# Patient Record
Sex: Male | Born: 1950 | Race: White | Hispanic: No | Marital: Married | Smoking: Former smoker
Health system: Southern US, Community
[De-identification: ages and names within clinical notes are randomized; demographics above are authoritative.]

## PROBLEM LIST (undated history)

## (undated) DIAGNOSIS — F32A Depression, unspecified: Secondary | ICD-10-CM

## (undated) DIAGNOSIS — F419 Anxiety disorder, unspecified: Secondary | ICD-10-CM

## (undated) DIAGNOSIS — G4730 Sleep apnea, unspecified: Secondary | ICD-10-CM

## (undated) DIAGNOSIS — K7460 Unspecified cirrhosis of liver: Secondary | ICD-10-CM

## (undated) DIAGNOSIS — F329 Major depressive disorder, single episode, unspecified: Secondary | ICD-10-CM

## (undated) DIAGNOSIS — M1990 Unspecified osteoarthritis, unspecified site: Secondary | ICD-10-CM

## (undated) DIAGNOSIS — Z8719 Personal history of other diseases of the digestive system: Secondary | ICD-10-CM

## (undated) DIAGNOSIS — I10 Essential (primary) hypertension: Secondary | ICD-10-CM

## (undated) DIAGNOSIS — K219 Gastro-esophageal reflux disease without esophagitis: Secondary | ICD-10-CM

## (undated) DIAGNOSIS — K759 Inflammatory liver disease, unspecified: Secondary | ICD-10-CM

## (undated) DIAGNOSIS — R0602 Shortness of breath: Secondary | ICD-10-CM

---

## 1988-07-05 HISTORY — PX: LITHOTRIPSY: SUR834

## 1989-07-05 DIAGNOSIS — G4730 Sleep apnea, unspecified: Secondary | ICD-10-CM

## 1989-07-05 HISTORY — DX: Sleep apnea, unspecified: G47.30

## 1996-07-05 HISTORY — PX: TONSILLECTOMY: SUR1361

## 1996-07-05 HISTORY — PX: TONSILLECTOMY AND ADENOIDECTOMY: SUR1326

## 2002-07-05 HISTORY — PX: BACK SURGERY: SHX140

## 2003-03-15 ENCOUNTER — Encounter: Payer: Self-pay | Admitting: Neurosurgery

## 2003-03-19 ENCOUNTER — Encounter: Payer: Self-pay | Admitting: Neurosurgery

## 2003-03-19 ENCOUNTER — Ambulatory Visit (HOSPITAL_COMMUNITY): Admission: RE | Admit: 2003-03-19 | Discharge: 2003-03-20 | Payer: Self-pay | Admitting: Neurosurgery

## 2003-12-16 ENCOUNTER — Other Ambulatory Visit: Payer: Self-pay

## 2006-01-08 ENCOUNTER — Emergency Department: Payer: Self-pay | Admitting: Emergency Medicine

## 2006-01-11 ENCOUNTER — Other Ambulatory Visit: Payer: Self-pay

## 2006-01-11 ENCOUNTER — Inpatient Hospital Stay: Payer: Self-pay | Admitting: Specialist

## 2006-01-18 ENCOUNTER — Ambulatory Visit: Payer: Self-pay | Admitting: Specialist

## 2006-01-27 ENCOUNTER — Ambulatory Visit: Payer: Self-pay | Admitting: Specialist

## 2006-05-18 ENCOUNTER — Other Ambulatory Visit: Payer: Self-pay

## 2006-05-18 ENCOUNTER — Emergency Department: Payer: Self-pay | Admitting: Emergency Medicine

## 2007-12-12 IMAGING — US ABDOMEN ULTRASOUND LIMITED
1 series · 17 of 25 positions shown · non-contrast
Comparison: none

REASON FOR EXAM: doppler ultrasound of portal and hepatic system; hepatic
encephalopathy
COMMENTS:

[Series 1: abdomen ultrasound limited · 17 of 31 slices shown]
[im 1/31]
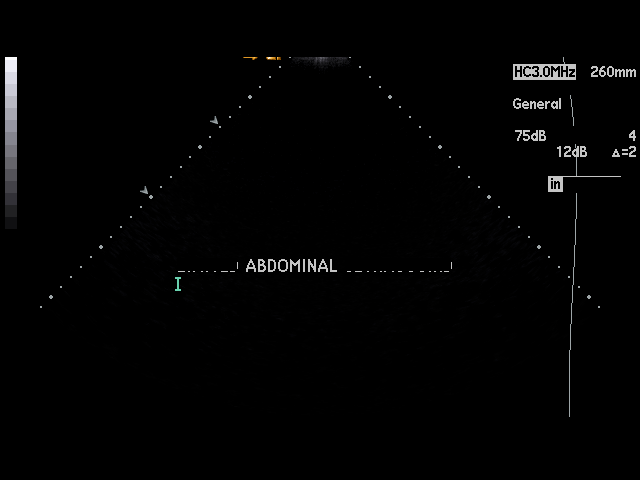
[im 3/31]
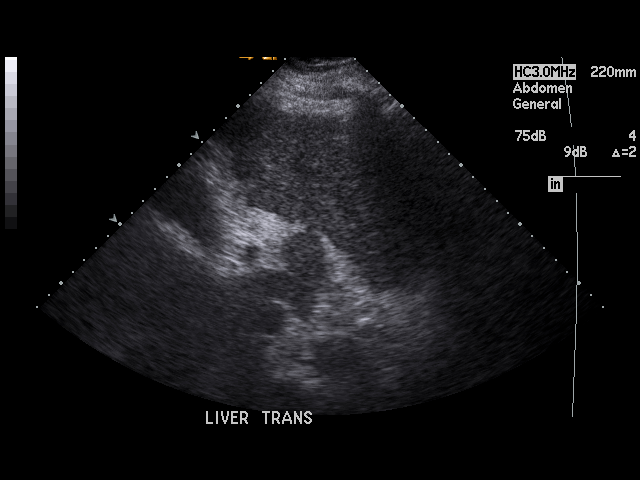
[im 4/31]
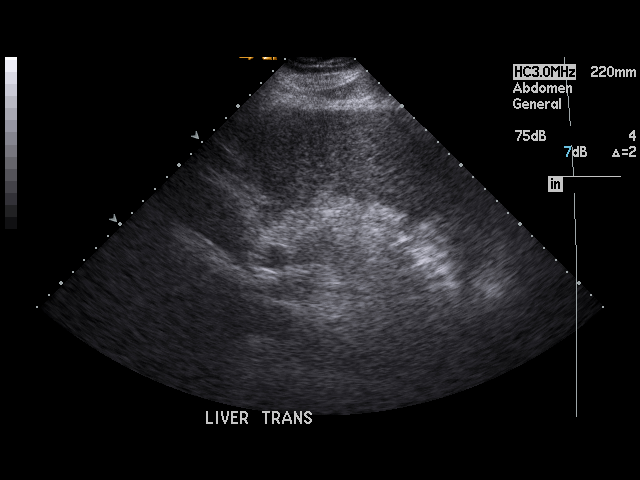
[im 7/31]
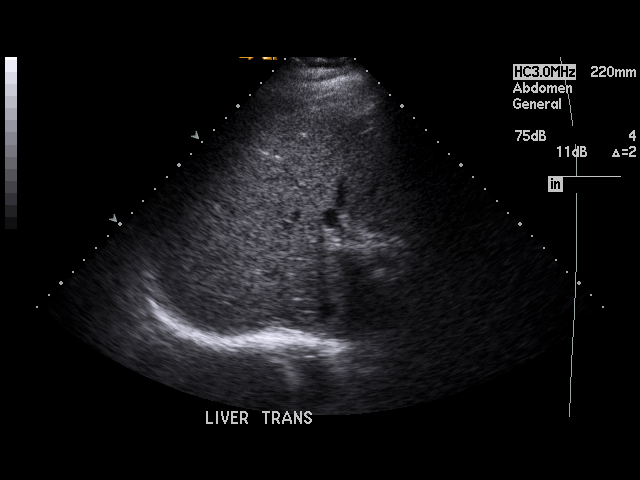
[im 8/31]
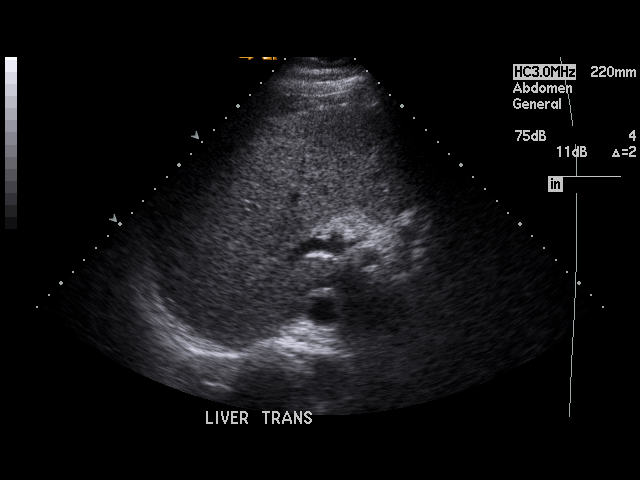
[im 11/31]
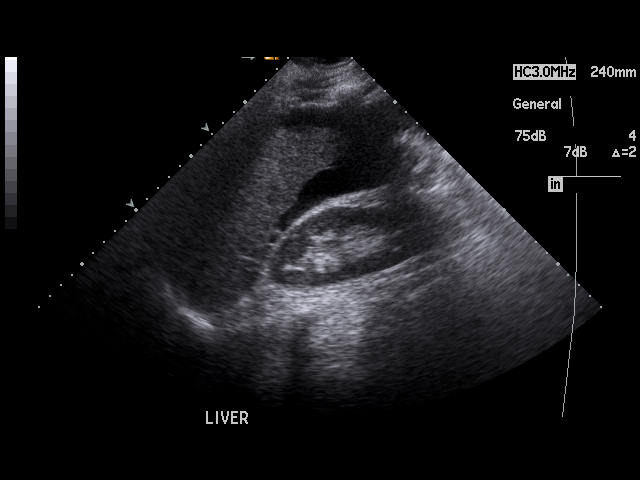
[im 12/31]
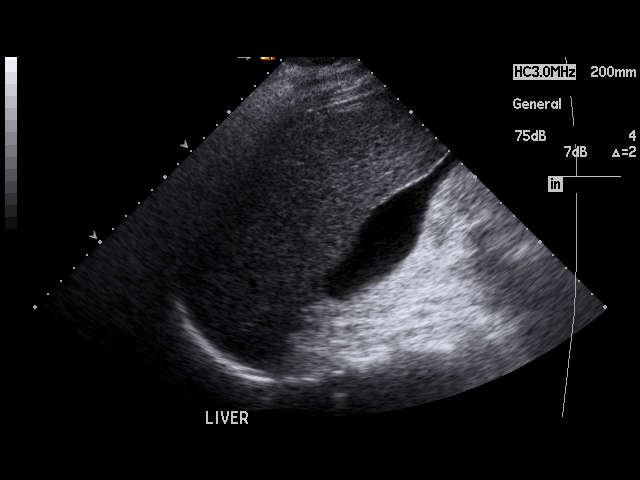
[im 14/31]
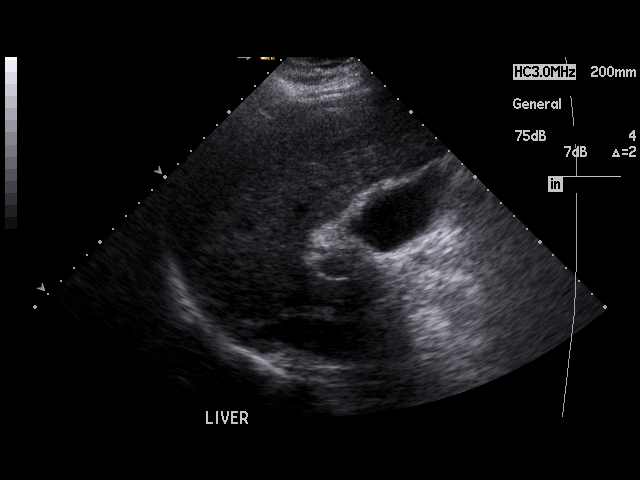
[im 16/31]
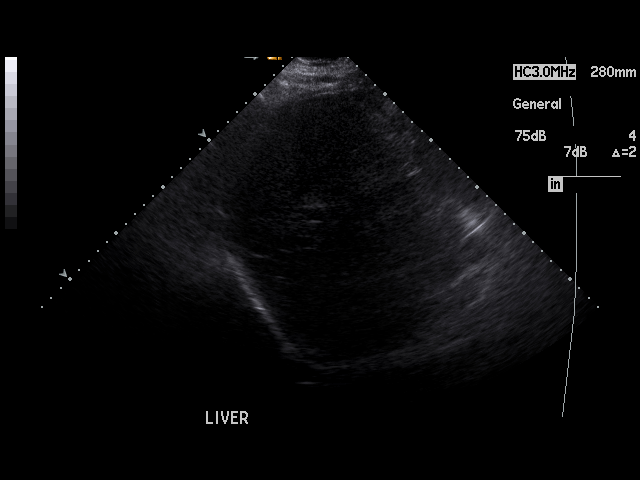
[im 17/31]
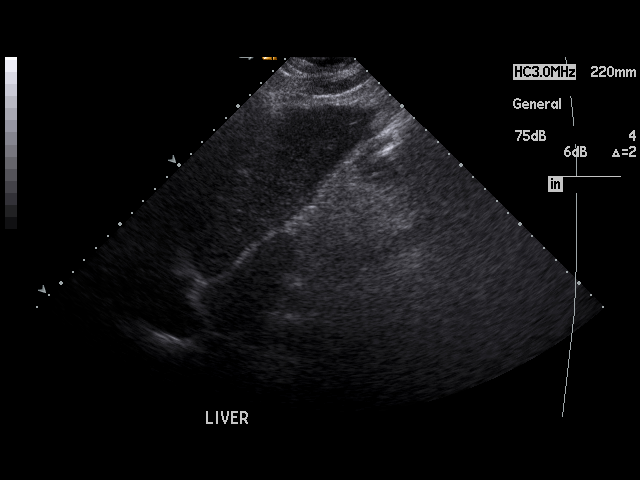
[im 19/31]
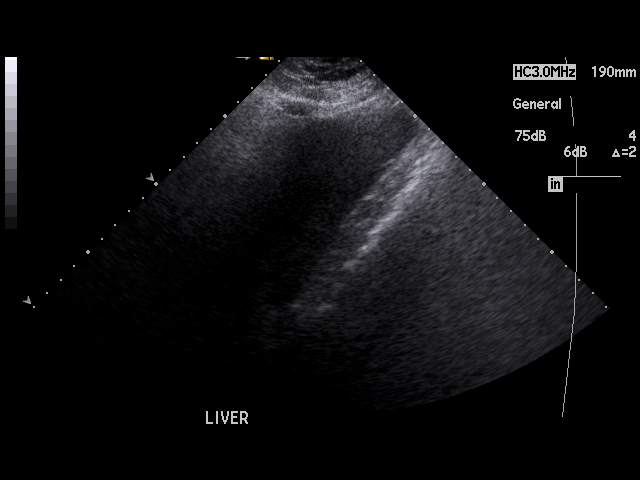
[im 21/31]
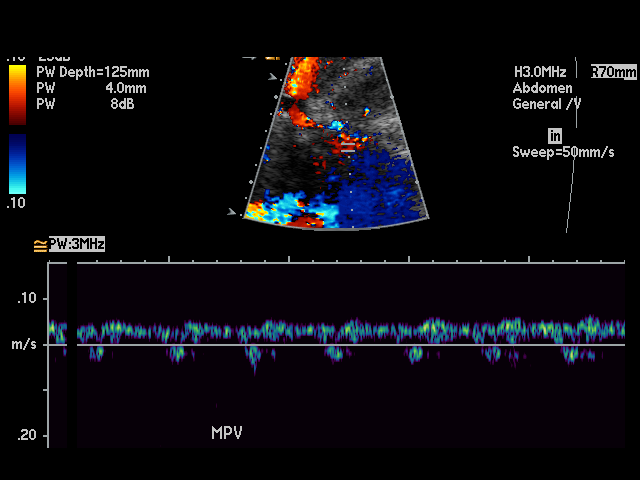
[im 23/31]
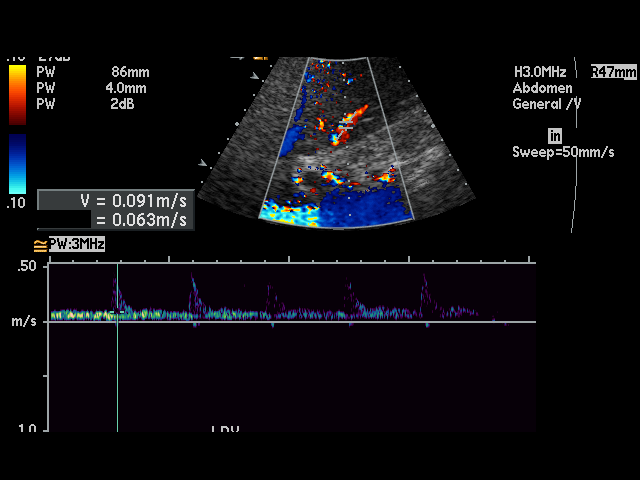
[im 24/31]
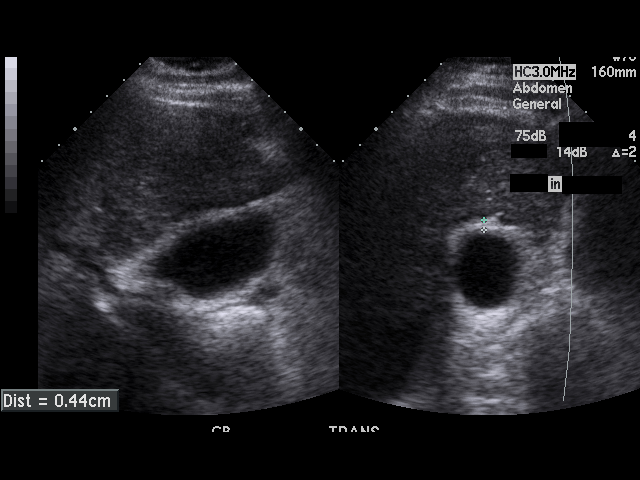
[im 27/31]
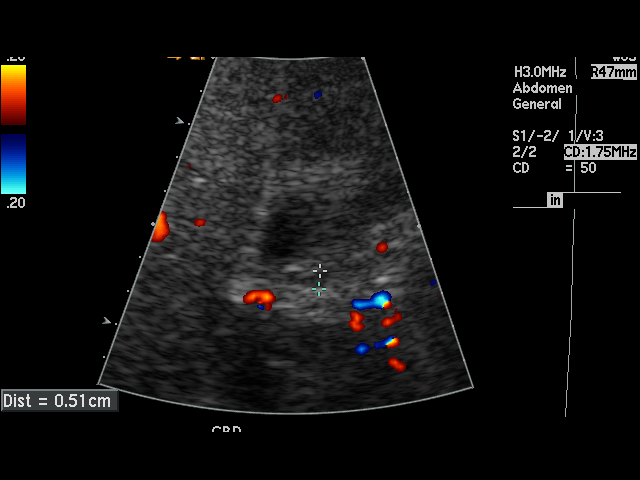
[im 28/31]
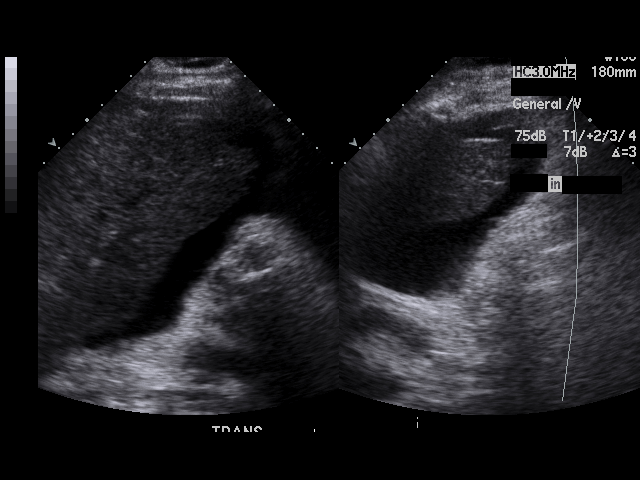
[im 31/31]
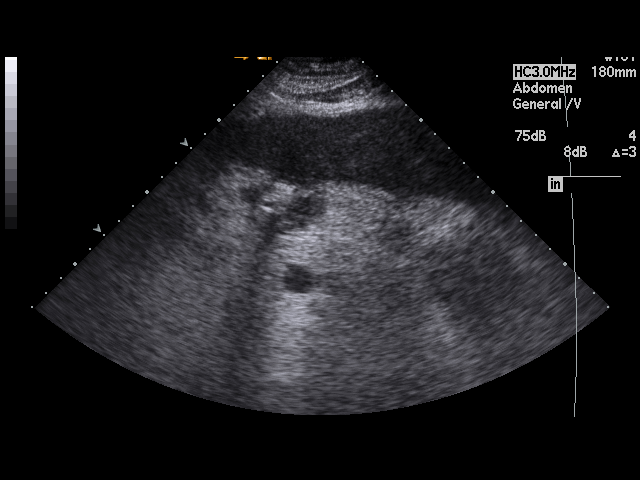

[17 of 25 positions shown; findings below may reference images not displayed]

PROCEDURE:     US  - US ABDOMEN LIMITED SURVEY  - January 12, 2006  [DATE]

RESULT:     Limited abdominal ultrasound examination for evaluation of the
liver and portal vein was performed. The liver is dense, suspicious for
fatty infiltration. No focal hepatic mass lesions are identified.  There is
noted a small amount of ascites inferior to the liver. Interrogation of the
portal vein shows hepatopetal flow.  The common bile duct measures 5.1 mm in
diameter, which is within normal limits. There is thickening of the
gallbladder wall, which measured 4.4 mm in thickness.  No gallstones are
seen.
IMPRESSION: 1)Probable fatty infiltration of the liver.

2)Ascites.

3)Hepatopetal flow is observed in the portal vein.

4)There is thickening of the gallbladder wall, but no gallstones are seen.

## 2007-12-13 IMAGING — CT CT ABD-PELV W/ CM
1 of 2 series · 15 of 32 positions shown, 19 images · non-contrast
Comparison: none

REASON FOR EXAM: Portal hypertension, hepatic vien thrombosis
COMMENTS:

[Series 2: abdomen · axial · 0.82mm/px · z∈[-22,+442]mm · 15 of 64 slices shown, 19 images]
[im 3/64  soft-tissue]
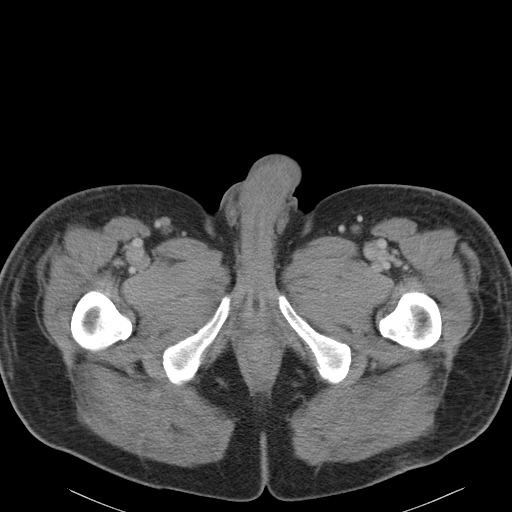
[im 3/64  bone]
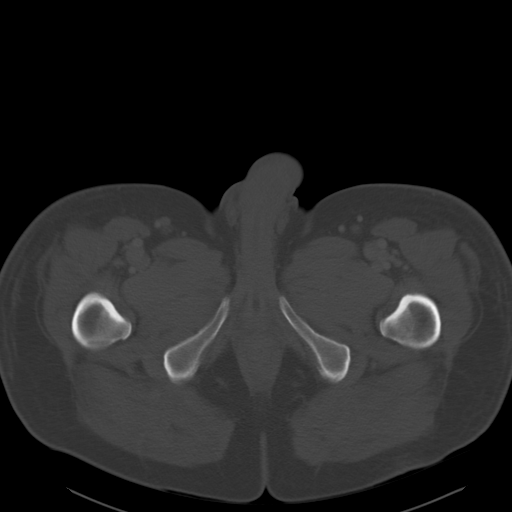
[im 8/64  soft-tissue]
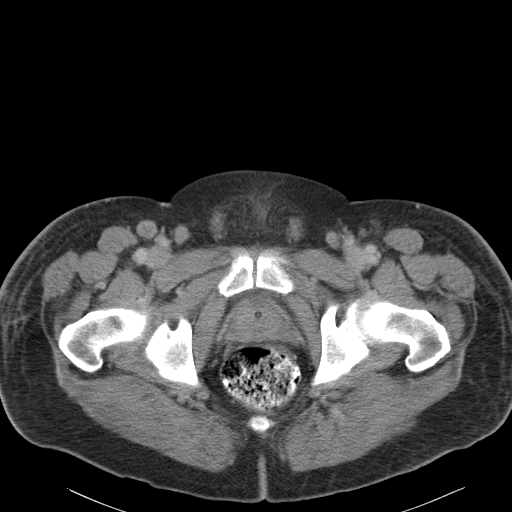
[im 13/64  soft-tissue]
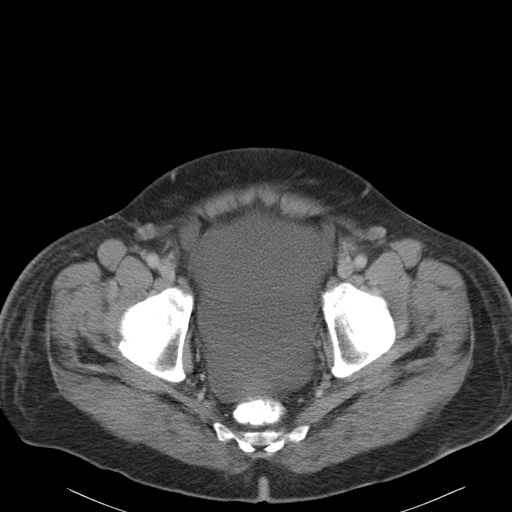
[im 17/64  soft-tissue]
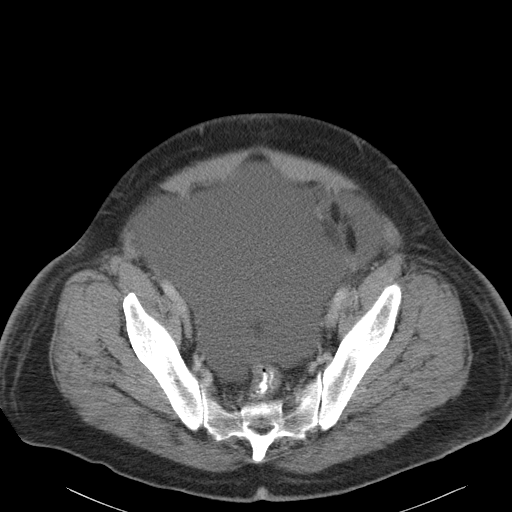
[im 22/64  soft-tissue]
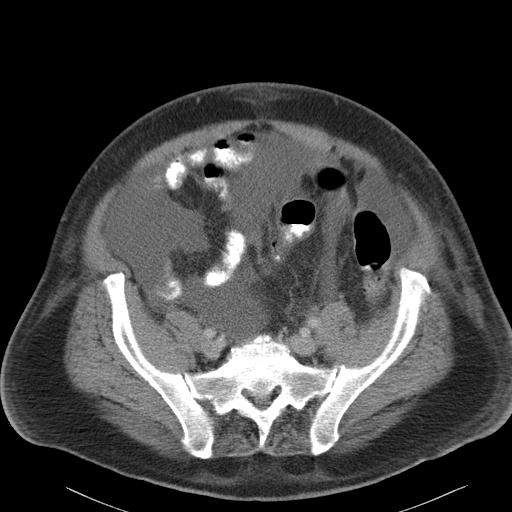
[im 27/64  soft-tissue]
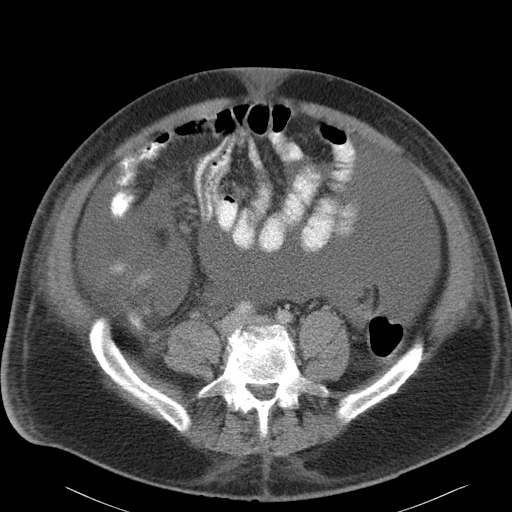
[im 32/64  soft-tissue]
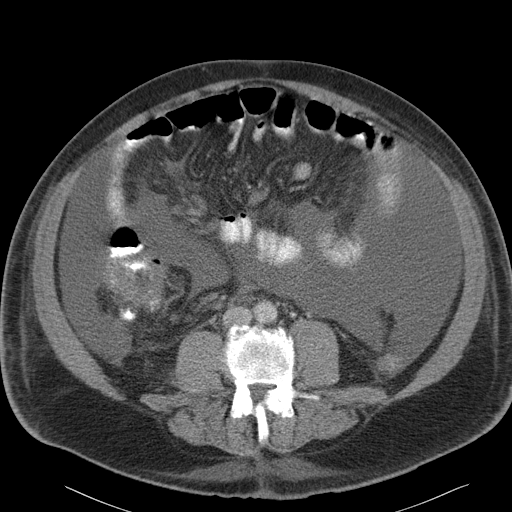
[im 37/64  soft-tissue]
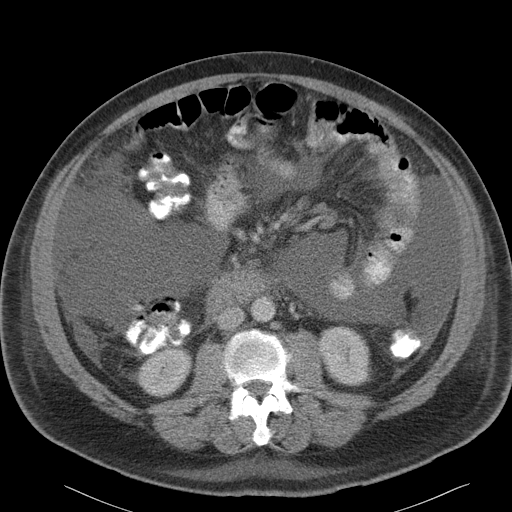
[im 42/64  soft-tissue]
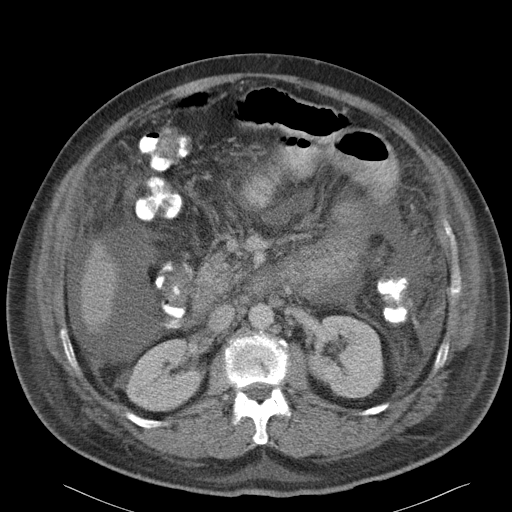
[im 42/64  bone]
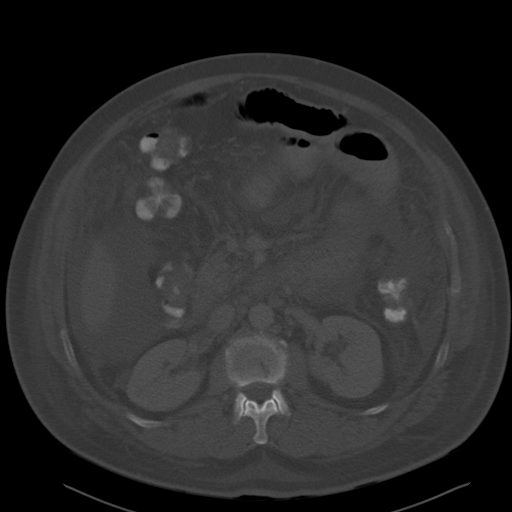
[im 47/64  soft-tissue]
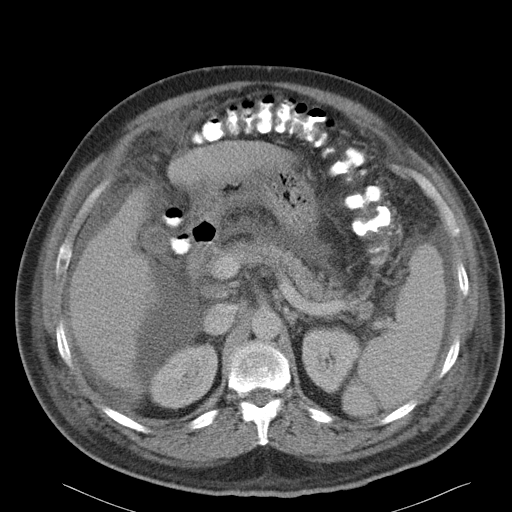
[im 51/64  soft-tissue]
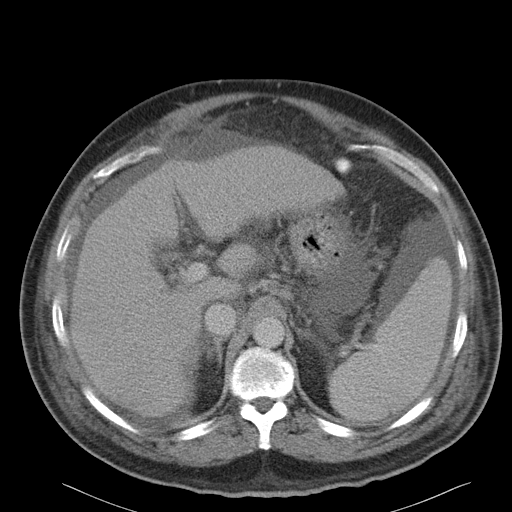
[im 54/64  lung]
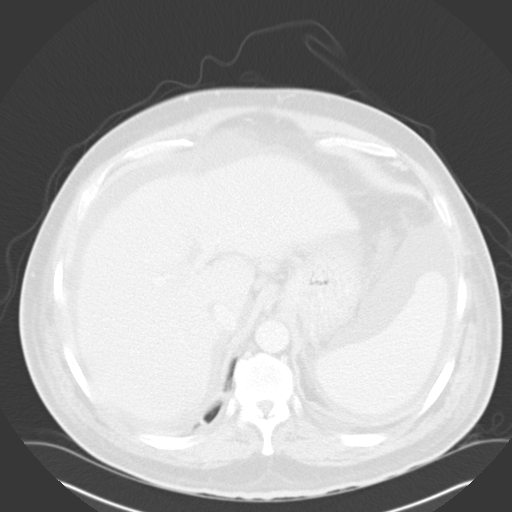
[im 56/64  soft-tissue]
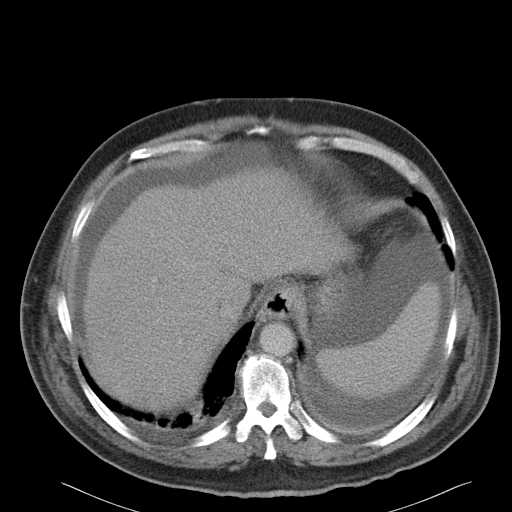
[im 56/64  lung]
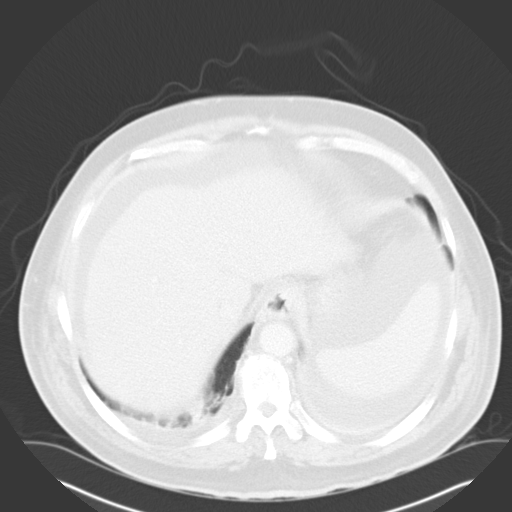
[im 59/64  lung]
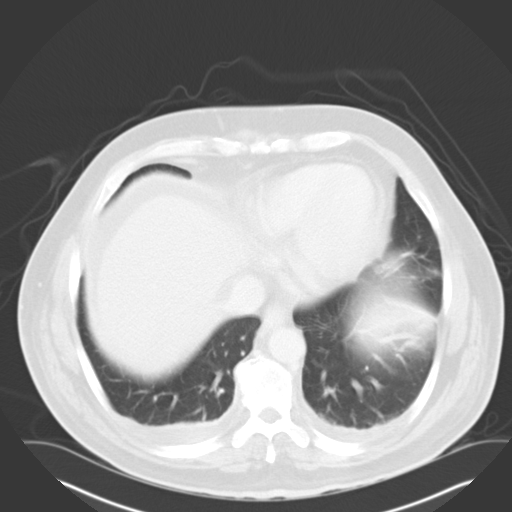
[im 61/64  soft-tissue]
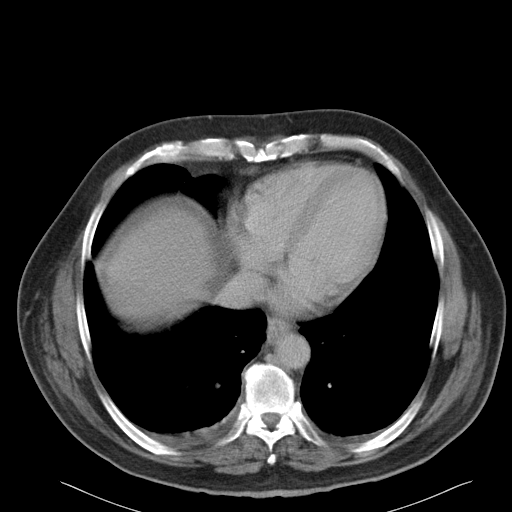
[im 61/64  lung]
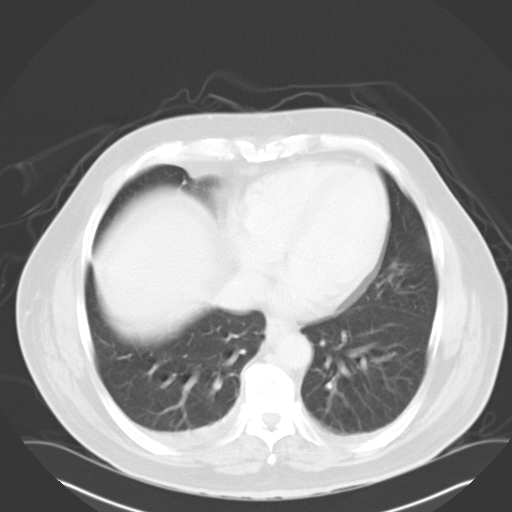

[15 of 32 positions shown; findings below may reference images not displayed]

PROCEDURE:     CT  - CT ABDOMEN / PELVIS  W  - January 13, 2006 [DATE]

RESULT:     The patient is being evaluated for possible hepatic vein
thrombosis.

There is ascites present. There is no intrahepatic ductal dilation. The
caliber of the inferior vena cava appears normal. The portal vein is normal
in density. I do not see findings suspicious for varices. The spleen is not
enlarged. There are tiny, bilateral pleural effusions layering posteriorly.
The nondistended stomach is normal in appearance. The pancreas exhibits no
focal mass or evidence of inflammatory change. The caliber of the abdominal
aorta is normal. The gallbladder is contracted. There are no adrenal masses.
The kidneys enhance well. The periaortic and pericaval regions exhibit no
evidence of lymphadenopathy. Within the pelvis, there is considerable free
fluid. There are mildly enlarged inguinal lymph nodes bilaterally. The
sigmoid colon is grossly normal. The urinary bladder is decompressed due to
the presence of a Foley catheter. The lung parenchyma in the costophrenic
gutters posteriorly exhibits no acute abnormality though I suspect there is
some atelectasis present.
IMPRESSION: 1.     There is considerable ascites present. The liver does not appear
markedly decreased in size but its convex outer surface is slightly
irregular. I do not see splenomegaly nor definite evidence of varices.
2.     The appearance of the portal vein is within the limits of normal.
There are no findings to suggest dilated hepatic veins.
3.     There are tiny, bilateral pleural effusions.
4.     The gallbladder is contracted without evidence of calcified stones.

## 2008-07-16 ENCOUNTER — Emergency Department: Payer: Self-pay | Admitting: Internal Medicine

## 2008-11-19 ENCOUNTER — Ambulatory Visit: Payer: Self-pay | Admitting: Nurse Practitioner

## 2010-06-08 ENCOUNTER — Ambulatory Visit: Payer: Self-pay | Admitting: Nurse Practitioner

## 2011-04-21 ENCOUNTER — Ambulatory Visit: Payer: Self-pay | Admitting: Nurse Practitioner

## 2011-05-12 ENCOUNTER — Ambulatory Visit: Payer: Self-pay | Admitting: Nurse Practitioner

## 2011-07-31 ENCOUNTER — Emergency Department: Payer: Self-pay | Admitting: Emergency Medicine

## 2011-10-15 LAB — COMPREHENSIVE METABOLIC PANEL
Albumin: 3.7 g/dL (ref 3.4–5.0)
Calcium, Total: 8.7 mg/dL (ref 8.5–10.1)
Chloride: 103 mmol/L (ref 98–107)
Co2: 31 mmol/L (ref 21–32)
Glucose: 116 mg/dL — ABNORMAL HIGH (ref 65–99)
Osmolality: 280 (ref 275–301)
SGOT(AST): 36 U/L (ref 15–37)
Sodium: 140 mmol/L (ref 136–145)

## 2011-10-15 LAB — CBC
HCT: 45.4 % (ref 40.0–52.0)
HGB: 15 g/dL (ref 13.0–18.0)
MCH: 30.1 pg (ref 26.0–34.0)
MCHC: 33.1 g/dL (ref 32.0–36.0)
Platelet: 117 10*3/uL — ABNORMAL LOW (ref 150–440)
RDW: 14 % (ref 11.5–14.5)

## 2011-10-15 LAB — CK TOTAL AND CKMB (NOT AT ARMC)
CK, Total: 343 U/L — ABNORMAL HIGH (ref 35–232)
CK-MB: 3 ng/mL (ref 0.5–3.6)

## 2011-10-16 ENCOUNTER — Observation Stay: Payer: Self-pay | Admitting: Internal Medicine

## 2011-10-16 LAB — LIPID PANEL: Cholesterol: 153 mg/dL (ref 0–200)

## 2011-10-16 LAB — URINALYSIS, COMPLETE
Blood: NEGATIVE
Leukocyte Esterase: NEGATIVE
Nitrite: NEGATIVE
Ph: 7 (ref 4.5–8.0)

## 2011-10-19 ENCOUNTER — Ambulatory Visit: Payer: Self-pay | Admitting: Internal Medicine

## 2012-05-12 ENCOUNTER — Ambulatory Visit: Payer: Self-pay | Admitting: Nurse Practitioner

## 2012-05-12 LAB — CREATININE, SERUM: Creatinine: 1.05 mg/dL (ref 0.60–1.30)

## 2012-07-07 ENCOUNTER — Other Ambulatory Visit: Payer: Self-pay | Admitting: Neurosurgery

## 2012-07-10 NOTE — Addendum Note (Signed)
Addended by: Julio Sicks on: 07/10/2012 07:40 AM   Modules accepted: Orders

## 2012-07-17 ENCOUNTER — Encounter (HOSPITAL_COMMUNITY): Payer: Self-pay

## 2012-07-17 ENCOUNTER — Encounter (HOSPITAL_COMMUNITY)
Admission: RE | Admit: 2012-07-17 | Discharge: 2012-07-17 | Disposition: A | Discharge status: Home / Self Care | Payer: Medicare Other | Source: Ambulatory Visit | Attending: Neurosurgery | Admitting: Neurosurgery

## 2012-07-17 HISTORY — DX: Unspecified osteoarthritis, unspecified site: M19.90

## 2012-07-17 HISTORY — DX: Anxiety disorder, unspecified: F41.9

## 2012-07-17 HISTORY — DX: Sleep apnea, unspecified: G47.30

## 2012-07-17 HISTORY — DX: Unspecified cirrhosis of liver: K74.60

## 2012-07-17 HISTORY — DX: Gastro-esophageal reflux disease without esophagitis: K21.9

## 2012-07-17 HISTORY — DX: Major depressive disorder, single episode, unspecified: F32.9

## 2012-07-17 HISTORY — DX: Shortness of breath: R06.02

## 2012-07-17 HISTORY — DX: Inflammatory liver disease, unspecified: K75.9

## 2012-07-17 HISTORY — DX: Personal history of other diseases of the digestive system: Z87.19

## 2012-07-17 HISTORY — DX: Essential (primary) hypertension: I10

## 2012-07-17 HISTORY — DX: Depression, unspecified: F32.A

## 2012-07-17 LAB — CBC WITH DIFFERENTIAL/PLATELET
Basophils Absolute: 0 10*3/uL (ref 0.0–0.1)
Eosinophils Relative: 2 % (ref 0–5)
Lymphocytes Relative: 37 % (ref 12–46)
MCV: 91.4 fL (ref 78.0–100.0)
Neutrophils Relative %: 47 % (ref 43–77)
Platelets: 138 10*3/uL — ABNORMAL LOW (ref 150–400)
RDW: 12.9 % (ref 11.5–15.5)
WBC: 7.6 10*3/uL (ref 4.0–10.5)

## 2012-07-17 LAB — COMPREHENSIVE METABOLIC PANEL
ALT: 20 U/L (ref 0–53)
AST: 23 U/L (ref 0–37)
Albumin: 3.9 g/dL (ref 3.5–5.2)
Calcium: 10.3 mg/dL (ref 8.4–10.5)
Creatinine, Ser: 0.83 mg/dL (ref 0.50–1.35)
GFR calc non Af Amer: >90 mL/min (ref >90)
Sodium: 138 mEq/L (ref 135–145)
Total Protein: 7.7 g/dL (ref 6.0–8.3)

## 2012-07-17 LAB — TYPE AND SCREEN
ABO/RH(D): O NEG
Antibody Screen: NEGATIVE

## 2012-07-17 LAB — APTT: aPTT: 31 seconds (ref 24–37)

## 2012-07-17 LAB — ABO/RH: ABO/RH(D): O NEG

## 2012-07-17 LAB — SURGICAL PCR SCREEN
MRSA, PCR: NEGATIVE
Staphylococcus aureus: POSITIVE — AB

## 2012-07-17 LAB — PROTIME-INR: INR: 1.08 (ref 0.00–1.49)

## 2012-07-17 MED ORDER — DEXTROSE 5 % IV SOLN
3.0000 g | INTRAVENOUS | Status: AC
  Administered 2012-07-18: 3 g via INTRAVENOUS
  Filled 2012-07-17: qty 3000

## 2012-07-17 NOTE — Progress Notes (Signed)
Primary: Meredith Mody at [facility]. [phone]- will request notes, ekg  Dr. Allena Katz, at [facility] ( liver cirrhosis) last visit 6 months ago- will request notes  [facility] : stress test 10/13, ekg, sleep study months ago-- will request  Dr. Lynn Ito (cardiologist) seen once echo couple yrs. 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

## 2012-07-17 NOTE — Consult Note (Signed)
Anesthesia Consult (per patient request):  Patient is a 62 year old male posted for 2 level lumbar laminectomy/decompression microdiscectomy on 07/18/12 at 0730 by Dr. Jordan Likes  His PAT appointment was on 07/17/12.  History includes HTN (he did not take his medications this morning), cirrhosis with history of  Hepatitis C s/p two different courses in Interferon and ribavirin with HCV RNA negative 02/10/06, Dr. Marice Potter, [facility], OSA without CPAP use due to claustrophobia, chronic DOE, morbid obesity (BMI 40), GERD, anxiety, depression, hiatal hernia, arthritis, T&A '98, back surgery '04.  PCP is Ninfa Linden, NP at [facility].  Nuclear stress test on 10/19/11 [facility]) showed no significant stress induced defects, EF 71% with normal wall motion.  EKG at the time showed NSR, incomplete right BBB.    He thinks he had an echo at [facility] (Dr. Park Breed) 2-3 years ago (report requested, but is still pending.)  Sleep study on 05/12/11 showed OSA, snoring.  Weight loss and Bi-level trestment at 24/16 cmH20 pressure, humidified, with a ramp recommended.  He has not been compliant with CPAP due to claustrophobia, but says he may be able to tolerate it if he takes either Xanax or Lunesta prior to bedtime.  (He does not currently have a CPAP machine and says Medicare is asking him to repeat another sleep study before one is issued since it has been > 6 months.)  CXR report on 07/17/12 showed no active cardiopulmonary disease. Chronic changes related to COPD  are noted.  Pre-operative labs noted.  AST/ALT WNL.  PLT 138.  PT/PTT WNL.  Glucose 178.  Cr 0.83.  Exam shows a pleasant, Caucasian male in NAD.  Abdomen obese.  Neck large.  Heart RRR, no murmur noted.  Lungs clear.  No significant LE edema.  No carotid bruits noted, but not well heard on the right.  He denies chest pain, SOB at rest.  He does have chronic DOE with mild activity.  His activity is significantly limited due to back and hip  pain.  He has not required a paracentesis since ~ 2006.  He has not had any recent acute symptoms felt related to his cirrhosis.    Despite his history of Hepatitis C with cirrhosis, his labs appear acceptable.  By records received, it has been 2012 since patient was last seen at [facility] liver clinic, but was compensated at that time.  This seems to be reflected by his most recent PCP notes.  He had a normal stress test within the past year.  We discussed potential need for post-operative bi-PAP/CPAP in patients with OSA and likely need for him to stay at least overnight with this history.    Shonna Chock, PA-C 07/17/12 1110

## 2012-07-17 NOTE — Pre-Procedure Instructions (Signed)
Jemel Ono Ballester  07/17/2012   Your procedure is scheduled on:  Tuesday Jan. 14,2014  Report to [facility] at 5:30 AM.  Call this number if you have problems the morning of surgery: [phone]   Remember:   Do not eat food or drink liquids after midnight.   Take these medicines the morning of surgery with A SIP OF WATER: xanax, neurontin, pain pill, inderal, prilosec, effexor   Do not wear jewelry, make-up or nail polish.  Do not wear lotions, powders, or perfumes.   Do not shave 48 hours prior to surgery. Men may shave face and neck.  Do not bring valuables to the hospital.  Contacts, dentures or bridgework may not be worn into surgery.  Leave suitcase in the car. After surgery it may be brought to your room.  For patients admitted to the hospital, checkout time is 11:00 AM the day of  discharge.   Patients discharged the day of surgery will not be allowed to drive  home.    Special Instructions: Shower using CHG 2 nights before surgery and the night before surgery.  If you shower the day of surgery use CHG.  Use special wash - you have one bottle of CHG for all showers.  You should use approximately 1/3 of the bottle for each shower.   Please read over the following fact sheets that you were given: Pain Booklet, Coughing and Deep Breathing, Blood Transfusion Information and MRSA Information

## 2012-07-18 ENCOUNTER — Encounter (HOSPITAL_COMMUNITY): Payer: Self-pay | Admitting: Vascular Surgery

## 2012-07-18 ENCOUNTER — Ambulatory Visit (HOSPITAL_COMMUNITY)
Admission: RE | Admit: 2012-07-18 | Discharge: 2012-07-19 | Disposition: A | Discharge status: Home / Self Care | Payer: Medicare Other | Source: Ambulatory Visit | Attending: Neurosurgery | Admitting: Neurosurgery

## 2012-07-18 ENCOUNTER — Ambulatory Visit (HOSPITAL_COMMUNITY): Payer: Medicare Other

## 2012-07-18 ENCOUNTER — Encounter (HOSPITAL_COMMUNITY): Payer: Self-pay | Admitting: Neurosurgery

## 2012-07-18 ENCOUNTER — Ambulatory Visit (HOSPITAL_COMMUNITY): Payer: Medicare Other | Admitting: Vascular Surgery

## 2012-07-18 ENCOUNTER — Encounter (HOSPITAL_COMMUNITY)
Admission: RE | Disposition: A | Discharge status: Home / Self Care | Payer: Self-pay | Source: Ambulatory Visit | Attending: Neurosurgery

## 2012-07-18 ENCOUNTER — Encounter (HOSPITAL_COMMUNITY): Payer: Self-pay | Admitting: *Deleted

## 2012-07-18 DIAGNOSIS — K7460 Unspecified cirrhosis of liver: Secondary | ICD-10-CM | POA: Insufficient documentation

## 2012-07-18 DIAGNOSIS — M5126 Other intervertebral disc displacement, lumbar region: Secondary | ICD-10-CM | POA: Insufficient documentation

## 2012-07-18 DIAGNOSIS — E669 Obesity, unspecified: Secondary | ICD-10-CM | POA: Insufficient documentation

## 2012-07-18 DIAGNOSIS — K219 Gastro-esophageal reflux disease without esophagitis: Secondary | ICD-10-CM | POA: Insufficient documentation

## 2012-07-18 DIAGNOSIS — M48062 Spinal stenosis, lumbar region with neurogenic claudication: Secondary | ICD-10-CM

## 2012-07-18 DIAGNOSIS — Z79899 Other long term (current) drug therapy: Secondary | ICD-10-CM | POA: Insufficient documentation

## 2012-07-18 DIAGNOSIS — F3289 Other specified depressive episodes: Secondary | ICD-10-CM | POA: Insufficient documentation

## 2012-07-18 DIAGNOSIS — G4730 Sleep apnea, unspecified: Secondary | ICD-10-CM | POA: Insufficient documentation

## 2012-07-18 DIAGNOSIS — Z01812 Encounter for preprocedural laboratory examination: Secondary | ICD-10-CM | POA: Insufficient documentation

## 2012-07-18 DIAGNOSIS — B1920 Unspecified viral hepatitis C without hepatic coma: Secondary | ICD-10-CM | POA: Insufficient documentation

## 2012-07-18 DIAGNOSIS — Z01818 Encounter for other preprocedural examination: Secondary | ICD-10-CM | POA: Insufficient documentation

## 2012-07-18 DIAGNOSIS — I10 Essential (primary) hypertension: Secondary | ICD-10-CM | POA: Insufficient documentation

## 2012-07-18 DIAGNOSIS — F411 Generalized anxiety disorder: Secondary | ICD-10-CM | POA: Insufficient documentation

## 2012-07-18 DIAGNOSIS — F329 Major depressive disorder, single episode, unspecified: Secondary | ICD-10-CM | POA: Insufficient documentation

## 2012-07-18 DIAGNOSIS — K449 Diaphragmatic hernia without obstruction or gangrene: Secondary | ICD-10-CM | POA: Insufficient documentation

## 2012-07-18 DIAGNOSIS — M47817 Spondylosis without myelopathy or radiculopathy, lumbosacral region: Secondary | ICD-10-CM | POA: Insufficient documentation

## 2012-07-18 DIAGNOSIS — Z87891 Personal history of nicotine dependence: Secondary | ICD-10-CM | POA: Insufficient documentation

## 2012-07-18 HISTORY — PX: LUMBAR LAMINECTOMY/DECOMPRESSION MICRODISCECTOMY: SHX5026

## 2012-07-18 SURGERY — LUMBAR LAMINECTOMY/DECOMPRESSION MICRODISCECTOMY 2 LEVELS
Anesthesia: General | Site: Spine Lumbar | Laterality: Left | Wound class: Clean

## 2012-07-18 MED ORDER — PHENYLEPHRINE HCL 10 MG/ML IJ SOLN
10.0000 mg | INTRAMUSCULAR | Status: DC | PRN
  Administered 2012-07-18: 10 ug/min via INTRAVENOUS

## 2012-07-18 MED ORDER — HEMOSTATIC AGENTS (NO CHARGE) OPTIME
TOPICAL | Status: DC | PRN
  Administered 2012-07-18 (×3): 1 via TOPICAL

## 2012-07-18 MED ORDER — BENAZEPRIL HCL 40 MG PO TABS
40.0000 mg | ORAL_TABLET | Freq: Every day | ORAL | Status: DC
  Administered 2012-07-19: 40 mg via ORAL
  Filled 2012-07-18: qty 1

## 2012-07-18 MED ORDER — GLYCOPYRROLATE 0.2 MG/ML IJ SOLN
INTRAMUSCULAR | Status: DC | PRN
  Administered 2012-07-18: 0.2 mg via INTRAVENOUS

## 2012-07-18 MED ORDER — SODIUM CHLORIDE 0.9 % IJ SOLN
3.0000 mL | Freq: Two times a day (BID) | INTRAMUSCULAR | Status: DC
  Administered 2012-07-18 – 2012-07-19 (×2): 3 mL via INTRAVENOUS

## 2012-07-18 MED ORDER — OXYCODONE HCL 10 MG PO TABS: 60.0000 mg | ORAL_TABLET | Freq: Every day | ORAL | Status: DC

## 2012-07-18 MED ORDER — HYDROMORPHONE HCL PF 1 MG/ML IJ SOLN
INTRAMUSCULAR | Status: AC
  Filled 2012-07-18: qty 1

## 2012-07-18 MED ORDER — CEFAZOLIN SODIUM 1-5 GM-% IV SOLN
1.0000 g | Freq: Three times a day (TID) | INTRAVENOUS | Status: AC
  Administered 2012-07-18 – 2012-07-19 (×2): 1 g via INTRAVENOUS
  Filled 2012-07-18 (×2): qty 50

## 2012-07-18 MED ORDER — ACETAMINOPHEN 325 MG PO TABS
650.0000 mg | ORAL_TABLET | ORAL | Status: DC | PRN
  Administered 2012-07-19: 650 mg via ORAL
  Filled 2012-07-18: qty 2

## 2012-07-18 MED ORDER — GABAPENTIN 300 MG PO CAPS
900.0000 mg | ORAL_CAPSULE | Freq: Every day | ORAL | Status: DC
  Administered 2012-07-19: 900 mg via ORAL
  Filled 2012-07-18: qty 3

## 2012-07-18 MED ORDER — SODIUM CHLORIDE 0.9 % IR SOLN
Status: DC | PRN
  Administered 2012-07-18: 09:00:00

## 2012-07-18 MED ORDER — DEXAMETHASONE SODIUM PHOSPHATE 4 MG/ML IJ SOLN
INTRAMUSCULAR | Status: DC | PRN
  Administered 2012-07-18: 10 mg via INTRAVENOUS

## 2012-07-18 MED ORDER — PANTOPRAZOLE SODIUM 40 MG PO TBEC
40.0000 mg | DELAYED_RELEASE_TABLET | Freq: Every day | ORAL | Status: DC
  Administered 2012-07-19: 40 mg via ORAL
  Filled 2012-07-18: qty 1

## 2012-07-18 MED ORDER — KETOROLAC TROMETHAMINE 30 MG/ML IJ SOLN
INTRAMUSCULAR | Status: DC | PRN
  Administered 2012-07-18: 30 mg via INTRAVENOUS

## 2012-07-18 MED ORDER — ALBUMIN HUMAN 5 % IV SOLN: 12.5000 g | Freq: Once | INTRAVENOUS | Status: DC

## 2012-07-18 MED ORDER — VENLAFAXINE HCL ER 75 MG PO CP24
225.0000 mg | ORAL_CAPSULE | Freq: Every day | ORAL | Status: DC
  Administered 2012-07-19: 225 mg via ORAL
  Filled 2012-07-18: qty 1

## 2012-07-18 MED ORDER — ALBUMIN HUMAN 5 % IV SOLN
INTRAVENOUS | Status: DC | PRN
  Administered 2012-07-18: 08:00:00 via INTRAVENOUS

## 2012-07-18 MED ORDER — EPHEDRINE SULFATE 50 MG/ML IJ SOLN
INTRAMUSCULAR | Status: DC | PRN
  Administered 2012-07-18 (×8): 10 mg via INTRAVENOUS

## 2012-07-18 MED ORDER — PROPOFOL 10 MG/ML IV BOLUS
INTRAVENOUS | Status: DC | PRN
  Administered 2012-07-18: 200 mg via INTRAVENOUS

## 2012-07-18 MED ORDER — MUPIROCIN 2 % EX OINT: 1.0000 "application " | TOPICAL_OINTMENT | Freq: Two times a day (BID) | CUTANEOUS | Status: DC

## 2012-07-18 MED ORDER — SODIUM CHLORIDE 0.9 % IV SOLN: 250.0000 mL | INTRAVENOUS | Status: DC

## 2012-07-18 MED ORDER — SENNA 8.6 MG PO TABS
1.0000 | ORAL_TABLET | Freq: Two times a day (BID) | ORAL | Status: DC
  Administered 2012-07-18 – 2012-07-19 (×3): 8.6 mg via ORAL
  Filled 2012-07-18 (×4): qty 1

## 2012-07-18 MED ORDER — ROCURONIUM BROMIDE 100 MG/10ML IV SOLN
INTRAVENOUS | Status: DC | PRN
  Administered 2012-07-18: 50 mg via INTRAVENOUS
  Administered 2012-07-18: 5 mg via INTRAVENOUS
  Administered 2012-07-18: 10 mg via INTRAVENOUS
  Administered 2012-07-18: 20 mg via INTRAVENOUS

## 2012-07-18 MED ORDER — HYDROMORPHONE HCL PF 1 MG/ML IJ SOLN
0.5000 mg | INTRAMUSCULAR | Status: DC | PRN
Start: 2012-07-18 — End: 2012-07-19

## 2012-07-18 MED ORDER — THROMBIN 5000 UNITS EX KIT
PACK | CUTANEOUS | Status: DC | PRN
  Administered 2012-07-18 (×6): 5000 [IU] via TOPICAL

## 2012-07-18 MED ORDER — BACITRACIN 50000 UNITS IM SOLR
INTRAMUSCULAR | Status: AC
  Filled 2012-07-18: qty 1

## 2012-07-18 MED ORDER — BUPIVACAINE HCL (PF) 0.25 % IJ SOLN
INTRAMUSCULAR | Status: DC | PRN
  Administered 2012-07-18: 20 mL

## 2012-07-18 MED ORDER — LACTULOSE 10 GM/15ML PO SOLN
10.0000 g | Freq: Every day | ORAL | Status: DC
  Administered 2012-07-18 – 2012-07-19 (×2): 10 g via ORAL
  Filled 2012-07-18 (×2): qty 15

## 2012-07-18 MED ORDER — SODIUM CHLORIDE 0.9 % IV SOLN
INTRAVENOUS | Status: AC
  Filled 2012-07-18: qty 500

## 2012-07-18 MED ORDER — ZOLPIDEM TARTRATE 5 MG PO TABS: 5.0000 mg | ORAL_TABLET | Freq: Every evening | ORAL | Status: DC | PRN

## 2012-07-18 MED ORDER — MIDAZOLAM HCL 5 MG/5ML IJ SOLN
INTRAMUSCULAR | Status: DC | PRN
  Administered 2012-07-18: 2 mg via INTRAVENOUS

## 2012-07-18 MED ORDER — HYDROMORPHONE HCL PF 1 MG/ML IJ SOLN
0.2500 mg | INTRAMUSCULAR | Status: DC | PRN
  Administered 2012-07-18 (×4): 0.5 mg via INTRAVENOUS

## 2012-07-18 MED ORDER — FENTANYL CITRATE 0.05 MG/ML IJ SOLN
INTRAMUSCULAR | Status: DC | PRN
  Administered 2012-07-18 (×2): 50 ug via INTRAVENOUS
  Administered 2012-07-18: 150 ug via INTRAVENOUS

## 2012-07-18 MED ORDER — HYDROCODONE-ACETAMINOPHEN 5-325 MG PO TABS: 1.0000 | ORAL_TABLET | ORAL | Status: DC | PRN

## 2012-07-18 MED ORDER — ALBUMIN HUMAN 5 % IV SOLN
INTRAVENOUS | Status: AC
  Administered 2012-07-18: 12.5 g
  Filled 2012-07-18: qty 250

## 2012-07-18 MED ORDER — DEXAMETHASONE SODIUM PHOSPHATE 10 MG/ML IJ SOLN
INTRAMUSCULAR | Status: AC
  Filled 2012-07-18: qty 1

## 2012-07-18 MED ORDER — ACETAMINOPHEN 10 MG/ML IV SOLN
1000.0000 mg | Freq: Once | INTRAVENOUS | Status: AC | PRN
  Administered 2012-07-18: 1000 mg via INTRAVENOUS

## 2012-07-18 MED ORDER — ACETAMINOPHEN 650 MG RE SUPP: 650.0000 mg | RECTAL | Status: DC | PRN

## 2012-07-18 MED ORDER — ONDANSETRON HCL 4 MG/2ML IJ SOLN
INTRAMUSCULAR | Status: DC | PRN
  Administered 2012-07-18: 4 mg via INTRAVENOUS

## 2012-07-18 MED ORDER — ALPRAZOLAM 0.5 MG PO TABS: 1.0000 mg | ORAL_TABLET | Freq: Every evening | ORAL | Status: DC | PRN

## 2012-07-18 MED ORDER — KETOROLAC TROMETHAMINE 30 MG/ML IJ SOLN
30.0000 mg | Freq: Four times a day (QID) | INTRAMUSCULAR | Status: DC
  Administered 2012-07-18 – 2012-07-19 (×4): 30 mg via INTRAVENOUS
  Filled 2012-07-18 (×8): qty 1

## 2012-07-18 MED ORDER — CYCLOBENZAPRINE HCL 10 MG PO TABS: 10.0000 mg | ORAL_TABLET | Freq: Three times a day (TID) | ORAL | Status: DC | PRN

## 2012-07-18 MED ORDER — 0.9 % SODIUM CHLORIDE (POUR BTL) OPTIME
TOPICAL | Status: DC | PRN
  Administered 2012-07-18: 1000 mL

## 2012-07-18 MED ORDER — MENTHOL 3 MG MT LOZG: 1.0000 | LOZENGE | OROMUCOSAL | Status: DC | PRN

## 2012-07-18 MED ORDER — ALUM & MAG HYDROXIDE-SIMETH 200-200-20 MG/5ML PO SUSP
30.0000 mL | Freq: Four times a day (QID) | ORAL | Status: DC | PRN
  Administered 2012-07-18: 30 mL via ORAL
  Filled 2012-07-18: qty 30

## 2012-07-18 MED ORDER — PHENYLEPHRINE HCL 10 MG/ML IJ SOLN
INTRAMUSCULAR | Status: DC | PRN
  Administered 2012-07-18: 40 ug via INTRAVENOUS
  Administered 2012-07-18: 80 ug via INTRAVENOUS

## 2012-07-18 MED ORDER — ARTIFICIAL TEARS OP OINT
TOPICAL_OINTMENT | OPHTHALMIC | Status: DC | PRN
  Administered 2012-07-18: 1 via OPHTHALMIC

## 2012-07-18 MED ORDER — LACTATED RINGERS IV SOLN
INTRAVENOUS | Status: DC | PRN
  Administered 2012-07-18 (×3): via INTRAVENOUS

## 2012-07-18 MED ORDER — PHENOL 1.4 % MT LIQD: 1.0000 | OROMUCOSAL | Status: DC | PRN

## 2012-07-18 MED ORDER — SODIUM CHLORIDE 0.9 % IJ SOLN: 3.0000 mL | INTRAMUSCULAR | Status: DC | PRN

## 2012-07-18 MED ORDER — LIDOCAINE HCL (CARDIAC) 20 MG/ML IV SOLN
INTRAVENOUS | Status: DC | PRN
  Administered 2012-07-18: 50 mg via INTRAVENOUS

## 2012-07-18 MED ORDER — ONDANSETRON HCL 4 MG/2ML IJ SOLN: 4.0000 mg | INTRAMUSCULAR | Status: DC | PRN

## 2012-07-18 MED ORDER — MUPIROCIN 2 % EX OINT
TOPICAL_OINTMENT | CUTANEOUS | Status: AC
  Filled 2012-07-18: qty 22

## 2012-07-18 MED ORDER — ACETAMINOPHEN 10 MG/ML IV SOLN
INTRAVENOUS | Status: AC
  Filled 2012-07-18: qty 100

## 2012-07-18 MED ORDER — OXYCODONE HCL 5 MG PO TABS
10.0000 mg | ORAL_TABLET | ORAL | Status: DC
  Administered 2012-07-18 – 2012-07-19 (×5): 10 mg via ORAL
  Filled 2012-07-18 (×5): qty 2

## 2012-07-18 MED ORDER — DEXAMETHASONE SODIUM PHOSPHATE 10 MG/ML IJ SOLN
10.0000 mg | INTRAMUSCULAR | Status: AC
  Administered 2012-07-18: 10 mg via INTRAVENOUS

## 2012-07-18 MED ORDER — MUPIROCIN 2 % EX OINT
1.0000 "application " | TOPICAL_OINTMENT | Freq: Two times a day (BID) | CUTANEOUS | Status: DC
  Administered 2012-07-18 – 2012-07-19 (×2): 1 via NASAL

## 2012-07-18 MED ORDER — OXYCODONE-ACETAMINOPHEN 5-325 MG PO TABS: 1.0000 | ORAL_TABLET | ORAL | Status: DC | PRN

## 2012-07-18 MED ORDER — ONDANSETRON HCL 4 MG/2ML IJ SOLN: 4.0000 mg | Freq: Once | INTRAMUSCULAR | Status: DC | PRN

## 2012-07-18 MED ORDER — TESTOSTERONE 30 MG/ACT TD SOLN: 60.0000 mg | TRANSDERMAL | Status: DC

## 2012-07-18 MED ORDER — PROPRANOLOL HCL 40 MG PO TABS
40.0000 mg | ORAL_TABLET | Freq: Every day | ORAL | Status: DC
  Administered 2012-07-19: 40 mg via ORAL
  Filled 2012-07-18: qty 1

## 2012-07-18 MED ORDER — MUPIROCIN 2 % EX OINT
TOPICAL_OINTMENT | Freq: Two times a day (BID) | CUTANEOUS | Status: DC
  Administered 2012-07-18: 07:00:00 via NASAL
  Filled 2012-07-18: qty 22

## 2012-07-18 SURGICAL SUPPLY — 55 items
BAG DECANTER FOR FLEXI CONT (MISCELLANEOUS) ×2 IMPLANT
BENZOIN TINCTURE PRP APPL 2/3 (GAUZE/BANDAGES/DRESSINGS) ×2 IMPLANT
BLADE SURG ROTATE 9660 (MISCELLANEOUS) ×2 IMPLANT
BRUSH SCRUB EZ PLAIN DRY (MISCELLANEOUS) ×2 IMPLANT
BUR CUTTER 7.0 ROUND (BURR) ×4 IMPLANT
CANISTER SUCTION 2500CC (MISCELLANEOUS) ×2 IMPLANT
CLOTH BEACON ORANGE TIMEOUT ST (SAFETY) ×2 IMPLANT
CONT SPEC 4OZ CLIKSEAL STRL BL (MISCELLANEOUS) ×2 IMPLANT
DECANTER SPIKE VIAL GLASS SM (MISCELLANEOUS) ×2 IMPLANT
DERMABOND ADHESIVE PROPEN (GAUZE/BANDAGES/DRESSINGS) ×1
DERMABOND ADVANCED (GAUZE/BANDAGES/DRESSINGS)
DERMABOND ADVANCED .7 DNX12 (GAUZE/BANDAGES/DRESSINGS) IMPLANT
DERMABOND ADVANCED .7 DNX6 (GAUZE/BANDAGES/DRESSINGS) ×1 IMPLANT
DRAPE LAPAROTOMY 100X72X124 (DRAPES) ×2 IMPLANT
DRAPE MICROSCOPE LEICA (MISCELLANEOUS) ×2 IMPLANT
DRAPE MICROSCOPE ZEISS OPMI (DRAPES) IMPLANT
DRAPE POUCH INSTRU U-SHP 10X18 (DRAPES) ×2 IMPLANT
DRAPE PROXIMA HALF (DRAPES) IMPLANT
DRAPE SURG 17X23 STRL (DRAPES) ×4 IMPLANT
ELECT REM PT RETURN 9FT ADLT (ELECTROSURGICAL) ×2
ELECTRODE REM PT RTRN 9FT ADLT (ELECTROSURGICAL) ×1 IMPLANT
EVACUATOR 1/8 PVC DRAIN (DRAIN) ×2 IMPLANT
GAUZE SPONGE 4X4 16PLY XRAY LF (GAUZE/BANDAGES/DRESSINGS) IMPLANT
GLOVE BIOGEL PI IND STRL 7.0 (GLOVE) ×3 IMPLANT
GLOVE BIOGEL PI INDICATOR 7.0 (GLOVE) ×3
GLOVE ECLIPSE 7.5 STRL STRAW (GLOVE) ×2 IMPLANT
GLOVE ECLIPSE 8.5 STRL (GLOVE) ×2 IMPLANT
GLOVE EXAM NITRILE LRG STRL (GLOVE) IMPLANT
GLOVE EXAM NITRILE MD LF STRL (GLOVE) IMPLANT
GLOVE EXAM NITRILE XL STR (GLOVE) IMPLANT
GLOVE EXAM NITRILE XS STR PU (GLOVE) IMPLANT
GLOVE SS BIOGEL STRL SZ 6.5 (GLOVE) ×2 IMPLANT
GLOVE SUPERSENSE BIOGEL SZ 6.5 (GLOVE) ×2
GOWN BRE IMP SLV AUR LG STRL (GOWN DISPOSABLE) ×2 IMPLANT
GOWN BRE IMP SLV AUR XL STRL (GOWN DISPOSABLE) ×4 IMPLANT
GOWN STRL REIN 2XL LVL4 (GOWN DISPOSABLE) IMPLANT
KIT BASIN OR (CUSTOM PROCEDURE TRAY) ×2 IMPLANT
KIT ROOM TURNOVER OR (KITS) ×2 IMPLANT
NEEDLE HYPO 22GX1.5 SAFETY (NEEDLE) ×2 IMPLANT
NEEDLE SPNL 22GX3.5 QUINCKE BK (NEEDLE) ×2 IMPLANT
NS IRRIG 1000ML POUR BTL (IV SOLUTION) ×2 IMPLANT
PACK LAMINECTOMY NEURO (CUSTOM PROCEDURE TRAY) ×2 IMPLANT
PAD ARMBOARD 7.5X6 YLW CONV (MISCELLANEOUS) ×10 IMPLANT
PATTIES SURGICAL 1X1 (DISPOSABLE) ×2 IMPLANT
RUBBERBAND STERILE (MISCELLANEOUS) ×4 IMPLANT
SPONGE GAUZE 4X4 12PLY (GAUZE/BANDAGES/DRESSINGS) ×2 IMPLANT
SPONGE SURGIFOAM ABS GEL SZ50 (HEMOSTASIS) ×12 IMPLANT
STRIP CLOSURE SKIN 1/2X4 (GAUZE/BANDAGES/DRESSINGS) ×2 IMPLANT
SUT VIC AB 2-0 CT1 18 (SUTURE) ×2 IMPLANT
SUT VIC AB 3-0 SH 8-18 (SUTURE) ×2 IMPLANT
SYR 20ML ECCENTRIC (SYRINGE) ×2 IMPLANT
TAPE CLOTH SURG 4X10 WHT LF (GAUZE/BANDAGES/DRESSINGS) ×2 IMPLANT
TOWEL OR 17X24 6PK STRL BLUE (TOWEL DISPOSABLE) ×2 IMPLANT
TOWEL OR 17X26 10 PK STRL BLUE (TOWEL DISPOSABLE) ×2 IMPLANT
WATER STERILE IRR 1000ML POUR (IV SOLUTION) ×2 IMPLANT

## 2012-07-18 NOTE — Op Note (Signed)
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  Bilateral L2-3 spondylosis with stenosis and involving the L2 nerve roots within their foramen and the L3 nerve roots within the lateral recess bilaterally.  Postoperative diagnosis: Same  Procedure Name: Left L1 /L2 laminotomy and microdiscectomy.  Bilateral L2-3 decompressive laminotomies with bilateral L2 and L3 decompressive foraminotomies  Surgeon:Meet Weathington A.Wilna Pennie, M.D.  Asst. Surgeon: Gerlene Fee    Anesthesia: General  Indication: 62 year old male with back and bilateral lower extremity symptoms of neurogenic claudication. Workup demonstrates evidence of a large partially calcified superior disc herniation with compression of the thecal sac and upper cauda equina. Patient also with marked spondylosis and stenosis at L2-3 causing compression of the exiting L2 nerve roots and the traversing L3 nerve roots as well as constriction of the thecal sac. Patient has failed conservative management and presents now for decompressive surgery.    Operative note:After induction of anesthesia, patient positioned prone onto a Wilson frame and appropriately padded. Lumbar region prepped and draped. Incision made and dissection performed bilaterally exposing the lamina and facet joints of L1-L2 and L3 on the left at L2-3 on the right. Retractors were placed and x-rays were taken. Levels were confirmed. Decompressive laminotomies were then performed at L2-3 bilaterally and at L1 to on the left utilizing a high-speed drill and Kerrison rongeurs to remove the inferior aspect of lamina above the medial aspect of the facet joint and the superior rim of the lamina below. Ligamentum flavum was elevated and resected in piecemeal fashion. At L2-3 the gutters were further undercut and decompressive foraminotomies were performed along the course the  exiting L2 and L3 nerve roots bilaterally. At this point a very thorough depression achieved at this level. There was no evidence of injury to thecal sac or nerve roots. The disc spaces and inspected bilaterally and found to be free of significant herniation. Attention placed back to the L1-2 level. Microscope was brought into the field and used for microdissection of the spinal canal. Thecal sac and L2 nerve root gently mobilized and retracted towards midline. Disc space and superior disc herniation identified. The space and size 15 blade. Disc material removed from the interspace utilizing pituitary rongeurs operative of pituitary rongeurs and Epstein curettes. Superior disc herniation dissected free using blunt nerve hooks and Epstein curettes and then removed in a piecemeal fashion using pituitary rongeurs. All elements of the disc herniation were resected. At this point a very thorough decompression had been achieved. Wound is irrigated out like solution. Gelfoam was placed topically for hemostasis. A medium Hemovac drain was left in the epidural space bilaterally. Wounds and closed in typical fashion. Steri-Strips and sterile dressing applied. No apparent complication. Patient tolerated the procedure well and returned to the recovery room postop.

## 2012-07-18 NOTE — H&P (Signed)
Raymond Lane is an 62 y.o. male.   Chief Complaint: Back and bilateral leg pain HPI: 62 year old male with progressive back and bilateral lower extremity symptoms consistent with neurogenic claudication failing conservative management. Workup demonstrates evidence of a large left L1 to paracentral disc herniation with moderately severe stenosis and severe spondylosis and stenosis at L2-3. Patient presents now for decompressive surgery.  Past Medical History  Diagnosis Date  . Hypertension   . Depression   . Anxiety   . Shortness of breath   . Sleep apnea 1991  . GERD (gastroesophageal reflux disease)   . H/O hiatal hernia   . Arthritis   . Hepatitis      hep. c (dx.15 yrs ago) undetectable for 7 yrs  . Cirrhosis of liver without mention of alcohol     Cirrhosis    Past Surgical History  Procedure Date  . Tonsillectomy 1998  . Tonsillectomy and adenoidectomy 1998  . Back surgery 2004  . Lithotripsy 1990    History reviewed. No pertinent family history. Social History:  reports that he quit smoking about 15 years ago. He does not have any smokeless tobacco history on file. He reports that he does not drink alcohol or use illicit drugs.  Allergies: No Known Allergies  Medications Prior to Admission  Medication Sig Dispense Refill  . ALPRAZolam (XANAX) 1 MG tablet Take 1 mg by mouth at bedtime as needed. For anxiety/sleep      . benazepril (LOTENSIN) 40 MG tablet Take 40 mg by mouth daily.      . eszopiclone (LUNESTA) 2 MG TABS Take 2 mg by mouth at bedtime as needed.      . gabapentin (NEURONTIN) 300 MG capsule Take 900 mg by mouth daily.      Marland Kitchen LACTULOSE PO Take 60 mg by mouth.      . mupirocin ointment (BACTROBAN) 2 % Place 1 application into the nose 2 (two) times daily.      Marland Kitchen omeprazole (PRILOSEC) 20 MG capsule Take 20 mg by mouth daily.      . Oxycodone HCl 10 MG TABS Take 60 mg by mouth daily.      . propranolol (INDERAL) 40 MG tablet Take 40 mg by mouth daily.        . Testosterone (AXIRON) 30 MG/ACT SOLN Place 60 mg onto the skin every other day.      . venlafaxine XR (EFFEXOR-XR) 75 MG 24 hr capsule Take 225 mg by mouth daily.        Results for orders placed during the hospital encounter of 07/17/12 (from the past 48 hour(s))  SURGICAL PCR SCREEN     Status: Abnormal   Collection Time   07/17/12 11:12 AM      Component Value Range Comment   MRSA, PCR NEGATIVE  NEGATIVE    Staphylococcus aureus POSITIVE (*) NEGATIVE   COMPREHENSIVE METABOLIC PANEL     Status: Abnormal   Collection Time   07/17/12 11:15 AM      Component Value Range Comment   Sodium 138  135 - 145 mEq/L    Potassium 5.1  3.5 - 5.1 mEq/L    Chloride 100  96 - 112 mEq/L    CO2 26  19 - 32 mEq/L    Glucose, Bld 178 (*) 70 - 99 mg/dL    BUN 13  6 - 23 mg/dL    Creatinine, Ser 2.44  0.50 - 1.35 mg/dL    Calcium 01.0  8.4 -  10.5 mg/dL    Total Protein 7.7  6.0 - 8.3 g/dL    Albumin 3.9  3.5 - 5.2 g/dL    AST 23  0 - 37 U/L    ALT 20  0 - 53 U/L    Alkaline Phosphatase 108  39 - 117 U/L    Total Bilirubin 0.8  0.3 - 1.2 mg/dL    GFR calc non Af Amer >90  >90 mL/min    GFR calc Af Amer >90  >90 mL/min   PROTIME-INR     Status: Normal   Collection Time   07/17/12 11:15 AM      Component Value Range Comment   Prothrombin Time 13.9  11.6 - 15.2 seconds    INR 1.08  0.00 - 1.49   CBC WITH DIFFERENTIAL     Status: Abnormal   Collection Time   07/17/12 11:15 AM      Component Value Range Comment   WBC 7.6  4.0 - 10.5 K/uL    RBC 5.48  4.22 - 5.81 MIL/uL    Hemoglobin 16.9  13.0 - 17.0 g/dL    HCT 13.0  86.5 - 78.4 %    MCV 91.4  78.0 - 100.0 fL    MCH 30.8  26.0 - 34.0 pg    MCHC 33.7  30.0 - 36.0 g/dL    RDW 69.6  29.5 - 28.4 %    Platelets 138 (*) 150 - 400 K/uL    Neutrophils Relative 47  43 - 77 %    Neutro Abs 3.5  1.7 - 7.7 K/uL    Lymphocytes Relative 37  12 - 46 %    Lymphs Abs 2.8  0.7 - 4.0 K/uL    Monocytes Relative 15 (*) 3 - 12 %    Monocytes Absolute 1.1 (*)  0.1 - 1.0 K/uL    Eosinophils Relative 2  0 - 5 %    Eosinophils Absolute 0.2  0.0 - 0.7 K/uL    Basophils Relative 0  0 - 1 %    Basophils Absolute 0.0  0.0 - 0.1 K/uL   APTT     Status: Normal   Collection Time   07/17/12 11:15 AM      Component Value Range Comment   aPTT 31  24 - 37 seconds   TYPE AND SCREEN     Status: Normal   Collection Time   07/17/12 11:21 AM      Component Value Range Comment   ABO/RH(D) O NEG      Antibody Screen NEG      Sample Expiration 07/20/2012     ABO/RH     Status: Normal   Collection Time   07/17/12 11:21 AM      Component Value Range Comment   ABO/RH(D) O NEG      Dg Chest 2 View  07/17/2012  *RADIOLOGY REPORT*  Clinical Data: Preop  CHEST - 2 VIEW  Comparison: None.  Findings: Normal heart size.  Clear lungs.  Increased AP diameter the chest.  Mild bronchitic changes.  No pneumothorax.  IMPRESSION: No active cardiopulmonary disease.  Chronic changes related to COPD are noted.   Original Report Authenticated By: Jolaine Click, M.D.     Review of Systems  Constitutional: Negative.   HENT: Negative.   Eyes: Negative.   Respiratory: Negative.   Cardiovascular: Negative.   Gastrointestinal: Negative.   Genitourinary: Negative.   Musculoskeletal: Negative.   Skin: Negative.   Neurological: Negative.  Endo/Heme/Allergies: Negative.   Psychiatric/Behavioral: Negative.     Blood pressure 141/84, pulse 62, temperature 98.4 F (36.9 C), temperature source Oral, resp. rate 20, SpO2 93.00%. Physical Exam  Vitals reviewed. Constitutional: He is oriented to person, place, and time. He appears well-developed and well-nourished.  HENT:  Head: Normocephalic and atraumatic.  Right Ear: External ear normal.  Left Ear: External ear normal.  Nose: Nose normal.  Mouth/Throat: Oropharynx is clear and moist.  Eyes: Conjunctivae normal and EOM are normal. Pupils are equal, round, and reactive to light. Right eye exhibits no discharge. Left eye exhibits no  discharge.  Neck: Normal range of motion. Neck supple. No tracheal deviation present. No thyromegaly present.  Cardiovascular: Normal rate, regular rhythm, normal heart sounds and intact distal pulses.   Respiratory: Effort normal and breath sounds normal. No respiratory distress. He has no wheezes.  GI: Soft. Bowel sounds are normal. He exhibits no distension. There is no tenderness.  Musculoskeletal: Normal range of motion. He exhibits no edema and no tenderness.  Neurological: He is alert and oriented to person, place, and time. He has normal reflexes. No cranial nerve deficit. Coordination normal.  Skin: Skin is warm and dry. No rash noted. No erythema. No pallor.  Psychiatric: He has a normal mood and affect. His behavior is normal. Judgment and thought content normal.     Assessment/Plan Left L1-2 herniated pulposus with stenosis and neurogenic claudication L2-3 stenosis with neurogenic claudication. Plan left L1 to laminotomy and microdiscectomy. Bilateral L2-3 decompressive laminotomies and foraminotomies. Risks and benefits been explained. Patient wishes to proceed.  Tilly Pernice A 07/18/2012, 7:41 AM

## 2012-07-18 NOTE — Progress Notes (Signed)
Pts. bp in 80s, Dr. Noreene Larsson aware and Albumin being given

## 2012-07-18 NOTE — Brief Op Note (Signed)
07/18/2012  10:21 AM  PATIENT:  Raymond Lane  62 y.o. male  PRE-OPERATIVE DIAGNOSIS:  HNP/stenosis  POST-OPERATIVE DIAGNOSIS:  herniated Nucleous Pulposus/Stenosis  PROCEDURE:  Procedure(s) (LRB) with comments: LUMBAR LAMINECTOMY/DECOMPRESSION MICRODISCECTOMY 2 LEVELS (Left) - Left Lumbar One-Two Laminectomy/Microdiskectomy,Bilateral Lumbar Two-Three Decompressive Lumbar Laminectomy  SURGEON:  Surgeon(s) and Role:    * Temple Pacini, MD - Primary    * Reinaldo Meeker, MD - Assisting  PHYSICIAN ASSISTANT:   ASSISTANTS:    ANESTHESIA:   general  EBL:  Total I/O In: 2250 [I.V.:2000; IV Piggyback:250] Out: 770 [Blood:770]  BLOOD ADMINISTERED:none  DRAINS: (Medium) Hemovact drain(s) in the Epidural space with  Suction Open   LOCAL MEDICATIONS USED:  MARCAINE     SPECIMEN:  No Specimen  DISPOSITION OF SPECIMEN:  N/A  COUNTS:  YES  TOURNIQUET:  * No tourniquets in log *  DICTATION: .Dragon Dictation  PLAN OF CARE: Admit for overnight observation  PATIENT DISPOSITION:  PACU - hemodynamically stable.   Delay start of Pharmacological VTE agent (>24hrs) due to surgical blood loss or risk of bleeding: yes

## 2012-07-18 NOTE — Preoperative (Signed)
Beta Blockers   Reason not to administer Beta Blockers:Not Applicable 

## 2012-07-18 NOTE — Progress Notes (Signed)
Pt. oked to go to room per ANesthesia

## 2012-07-18 NOTE — Transfer of Care (Signed)
Immediate Anesthesia Transfer of Care Note  Patient: Raymond Lane  Procedure(s) Performed: Procedure(s) (LRB) with comments: LUMBAR LAMINECTOMY/DECOMPRESSION MICRODISCECTOMY 2 LEVELS (Left) - Left Lumbar One-Two Laminectomy/Microdiskectomy,Bilateral Lumbar Two-Three Decompressive Lumbar Laminectomy  Patient Location: PACU  Anesthesia Type:General  Level of Consciousness: awake, alert  and oriented  Airway & Oxygen Therapy: Patient Spontanous Breathing and Patient connected to nasal cannula oxygen  Post-op Assessment: Report given to PACU RN and Post -op Vital signs reviewed and stable  Post vital signs: Reviewed and stable  Complications: No apparent anesthesia complications

## 2012-07-18 NOTE — Anesthesia Preprocedure Evaluation (Addendum)
Anesthesia Evaluation  Patient identified by MRN, date of birth, ID band Patient awake    Reviewed: Allergy & Precautions, H&P , NPO status , Patient's Chart, lab work & pertinent test results, reviewed documented beta blocker date and time   History of Anesthesia Complications Negative for: history of anesthetic complications  Airway Mallampati: II TM Distance: >3 FB Neck ROM: Full    Dental  (+) Teeth Intact and Dental Advisory Given   Pulmonary shortness of breath, sleep apnea , former smoker,  breath sounds clear to auscultation        Cardiovascular hypertension, Pt. on medications and Pt. on home beta blockers Rhythm:Regular Rate:Normal     Neuro/Psych Anxiety Depression    GI/Hepatic hiatal hernia, GERD-  Medicated and Controlled,(+) Hepatitis -, C  Endo/Other  negative endocrine ROS  Renal/GU negative Renal ROS     Musculoskeletal negative musculoskeletal ROS (+)   Abdominal (+) + obese,   Peds  Hematology negative hematology ROS (+)   Anesthesia Other Findings   Reproductive/Obstetrics negative OB ROS                         Anesthesia Physical Anesthesia Plan  ASA: III  Anesthesia Plan: General   Post-op Pain Management:    Induction: Intravenous  Airway Management Planned: Oral ETT  Additional Equipment:   Intra-op Plan:   Post-operative Plan:   Informed Consent: I have reviewed the patients History and Physical, chart, labs and discussed the procedure including the risks, benefits and alternatives for the proposed anesthesia with the patient or authorized representative who has indicated his/her understanding and acceptance.   Dental advisory given  Plan Discussed with: CRNA and Surgeon  Anesthesia Plan Comments: ( Htn Obesity Sleep apnea does not use CPAP H/O Hepatitis C S/P interferon traetment  Plan GA with oral ETT  Kipp Brood, MD)         Anesthesia Quick Evaluation

## 2012-07-18 NOTE — Anesthesia Postprocedure Evaluation (Signed)
  Anesthesia Post-op Note  Patient: Raymond Lane  Procedure(s) Performed: Procedure(s) (LRB) with comments: LUMBAR LAMINECTOMY/DECOMPRESSION MICRODISCECTOMY 2 LEVELS (Left) - Left Lumbar One-Two Laminectomy/Microdiskectomy,Bilateral Lumbar Two-Three Decompressive Lumbar Laminectomy  Patient Location: PACU  Anesthesia Type:General  Level of Consciousness: awake, alert  and oriented  Airway and Oxygen Therapy: Patient Spontanous Breathing and Patient connected to nasal cannula oxygen  Post-op Pain: mild  Post-op Assessment: Post-op Vital signs reviewed and Patient's Cardiovascular Status Stable  Post-op Vital Signs: stable  Complications: No apparent anesthesia complications

## 2012-07-19 MED ORDER — TAMSULOSIN HCL 0.4 MG PO CAPS
0.4000 mg | ORAL_CAPSULE | Freq: Every day | ORAL | Status: DC
  Administered 2012-07-19: 0.4 mg via ORAL
  Filled 2012-07-19: qty 1

## 2012-07-19 MED ORDER — CYCLOBENZAPRINE HCL 10 MG PO TABS: 10.0000 mg | ORAL_TABLET | Freq: Three times a day (TID) | ORAL | Status: AC | PRN

## 2012-07-19 MED ORDER — TAMSULOSIN HCL 0.4 MG PO CAPS: 0.4000 mg | ORAL_CAPSULE | Freq: Every day | ORAL | Status: AC

## 2012-07-19 MED ORDER — OXYCODONE HCL 10 MG PO TABS: 10.0000 mg | ORAL_TABLET | ORAL | Status: AC

## 2012-07-19 NOTE — Progress Notes (Signed)
Utilization review completed. Hanh Kertesz, RN, BSN. 

## 2012-07-19 NOTE — Progress Notes (Signed)
Pt voiding activity post I&O cath.   1. Voided 300cc & Bladder Scan 299cc  2. Voided 200cc & Bladder Scan 389cc   Called Dr. Jordan Likes with results. Order given to D/C patient home. Rema Fendt, RN

## 2012-07-19 NOTE — Progress Notes (Signed)
Pt and wife given D/C instructions with Rx. Pt verbalized understanding of teaching. Pt D/C'd home via wheelchair with wife per MD order. Rema Fendt, RN

## 2012-07-19 NOTE — Discharge Summary (Signed)
Physician Discharge Summary  Patient ID: Raymond Lane MRN: [id number] DOB/AGE: 63-May-1952 63 y.o.  Admit date: 07/18/2012 Discharge date: 07/19/2012  Admission Diagnoses:  Discharge Diagnoses:  Active Problems:  Lumbar stenosis with neurogenic claudication   Discharged Condition: good  Hospital Course: Patient mid-to the hospital where he underwent uncomplicated L1 to laminotomy and microdiscectomy and bilateral L2-3 decompressive laminotomies. Postoperatively he said resolution of his back and lower extremity pain. His strength sensation are intact. He is up ambulating without difficulty. He's had some difficulty with urinary retention. But this is recently improved.  Consults:   Significant Diagnostic Studies:   Treatments:   Discharge Exam: Blood pressure 156/93, pulse 89, temperature 97.8 F (36.6 C), temperature source Oral, resp. rate 18, SpO2 92.00%. Awake and alert. Oriented and appropriate. Cranial nerve function is intact. Motor and sensory function extremities normal. Wound clean dry and intact. Chest and abdomen benign.  Disposition:      Medication List     As of 07/19/2012  9:38 AM    TAKE these medications         ALPRAZolam 1 MG tablet   Commonly known as: XANAX   Take 1 mg by mouth at bedtime as needed. For anxiety/sleep      AXIRON 30 MG/ACT Soln   Generic drug: Testosterone   Place 60 mg onto the skin every other day.      benazepril 40 MG tablet   Commonly known as: LOTENSIN   Take 40 mg by mouth daily.      cyclobenzaprine 10 MG tablet   Commonly known as: FLEXERIL   Take 1 tablet (10 mg total) by mouth 3 (three) times daily as needed for muscle spasms.      eszopiclone 2 MG Tabs   Commonly known as: LUNESTA   Take 2 mg by mouth at bedtime as needed.      gabapentin 300 MG capsule   Commonly known as: NEURONTIN   Take 900 mg by mouth daily.      LACTULOSE PO   Take 60 mg by mouth.      mupirocin ointment 2 %   Commonly known as:  BACTROBAN   Place 1 application into the nose 2 (two) times daily.      omeprazole 20 MG capsule   Commonly known as: PRILOSEC   Take 20 mg by mouth daily.      Oxycodone HCl 10 MG Tabs   Take 1 tablet (10 mg total) by mouth every 4 (four) hours.      propranolol 40 MG tablet   Commonly known as: INDERAL   Take 40 mg by mouth daily.      venlafaxine XR 75 MG 24 hr capsule   Commonly known as: EFFEXOR-XR   Take 225 mg by mouth daily.           Follow-up Information    Follow up with Amritha Yorke A, MD. Call in 1 week. (As for Lurena Joiner)    Contact information:   [address] [place] [address] [phone]          Signed: Temple Pacini 07/19/2012, 9:38 AM

## 2012-07-20 ENCOUNTER — Encounter (HOSPITAL_COMMUNITY): Payer: Self-pay | Admitting: Neurosurgery

## 2014-03-05 ENCOUNTER — Ambulatory Visit: Payer: Self-pay | Admitting: Hospice and Palliative Medicine

## 2014-03-09 ENCOUNTER — Inpatient Hospital Stay: Payer: Self-pay | Admitting: Specialist

## 2014-03-09 LAB — COMPREHENSIVE METABOLIC PANEL
ALK PHOS: 672 U/L — AB
ALT: 72 U/L — AB
AST: 112 U/L — AB (ref 15–37)
Albumin: 2.3 g/dL — ABNORMAL LOW (ref 3.4–5.0)
Anion Gap: 6 — ABNORMAL LOW (ref 7–16)
BILIRUBIN TOTAL: 3.8 mg/dL — AB (ref 0.2–1.0)
BUN: 31 mg/dL — AB (ref 7–18)
CALCIUM: 8.5 mg/dL (ref 8.5–10.1)
CO2: 31 mmol/L (ref 21–32)
Chloride: 97 mmol/L — ABNORMAL LOW (ref 98–107)
Creatinine: 1.14 mg/dL (ref 0.60–1.30)
GLUCOSE: 149 mg/dL — AB (ref 65–99)
Osmolality: 278 (ref 275–301)
POTASSIUM: 3.9 mmol/L (ref 3.5–5.1)
Sodium: 134 mmol/L — ABNORMAL LOW (ref 136–145)
TOTAL PROTEIN: 6.9 g/dL (ref 6.4–8.2)

## 2014-03-09 LAB — PROTIME-INR
INR: 1.1
Prothrombin Time: 14.3 secs (ref 11.5–14.7)

## 2014-03-09 LAB — CBC WITH DIFFERENTIAL/PLATELET
Basophil: 1 %
Eosinophil: 1 %
HCT: 34.8 % — AB (ref 40.0–52.0)
HGB: 11.2 g/dL — ABNORMAL LOW (ref 13.0–18.0)
LYMPHS PCT: 14 %
MCH: 30.1 pg (ref 26.0–34.0)
MCHC: 32.3 g/dL (ref 32.0–36.0)
MCV: 93 fL (ref 80–100)
Monocytes: 6 %
Platelet: 294 10*3/uL (ref 150–440)
RBC: 3.74 10*6/uL — ABNORMAL LOW (ref 4.40–5.90)
RDW: 14.6 % — ABNORMAL HIGH (ref 11.5–14.5)
SEGMENTED NEUTROPHILS: 78 %
WBC: 15.4 10*3/uL — AB (ref 3.8–10.6)

## 2014-03-09 LAB — HEMOGLOBIN: HGB: 10.8 g/dL — AB (ref 13.0–18.0)

## 2014-03-09 LAB — OCCULT BLOOD X 1 CARD TO LAB, STOOL: OCCULT BLOOD, FECES: POSITIVE

## 2014-03-10 LAB — COMPREHENSIVE METABOLIC PANEL
ALBUMIN: 2 g/dL — AB (ref 3.4–5.0)
ALT: 61 U/L
Alkaline Phosphatase: 540 U/L — ABNORMAL HIGH
Anion Gap: 6 — ABNORMAL LOW (ref 7–16)
BILIRUBIN TOTAL: 3.5 mg/dL — AB (ref 0.2–1.0)
BUN: 26 mg/dL — AB (ref 7–18)
CALCIUM: 8 mg/dL — AB (ref 8.5–10.1)
CHLORIDE: 103 mmol/L (ref 98–107)
CO2: 28 mmol/L (ref 21–32)
Creatinine: 0.89 mg/dL (ref 0.60–1.30)
EGFR (Non-African Amer.): >60
GLUCOSE: 124 mg/dL — AB (ref 65–99)
OSMOLALITY: 280 (ref 275–301)
Potassium: 4.2 mmol/L (ref 3.5–5.1)
SGOT(AST): 86 U/L — ABNORMAL HIGH (ref 15–37)
SODIUM: 137 mmol/L (ref 136–145)
TOTAL PROTEIN: 6.2 g/dL — AB (ref 6.4–8.2)

## 2014-03-10 LAB — CBC WITH DIFFERENTIAL/PLATELET
BASOS ABS: 0 10*3/uL (ref 0.0–0.1)
BASOS PCT: 0.6 %
EOS ABS: 0.1 10*3/uL (ref 0.0–0.7)
EOS PCT: 1 %
HCT: 30.3 % — ABNORMAL LOW (ref 40.0–52.0)
HGB: 10.2 g/dL — ABNORMAL LOW (ref 13.0–18.0)
LYMPHS ABS: 1.4 10*3/uL (ref 1.0–3.6)
Lymphocyte %: 17.4 %
MCH: 31.2 pg (ref 26.0–34.0)
MCHC: 33.7 g/dL (ref 32.0–36.0)
MCV: 93 fL (ref 80–100)
MONO ABS: 1.1 x10 3/mm — AB (ref 0.2–1.0)
MONOS PCT: 13.8 %
NEUTROS ABS: 5.5 10*3/uL (ref 1.4–6.5)
NEUTROS PCT: 67.2 %
PLATELETS: 252 10*3/uL (ref 150–440)
RBC: 3.28 10*6/uL — AB (ref 4.40–5.90)
RDW: 14.8 % — AB (ref 11.5–14.5)
WBC: 8.2 10*3/uL (ref 3.8–10.6)

## 2014-03-10 LAB — HEMOGLOBIN
HGB: 10.2 g/dL — AB (ref 13.0–18.0)
HGB: 10.7 g/dL — ABNORMAL LOW (ref 13.0–18.0)
HGB: 9.9 g/dL — AB (ref 13.0–18.0)

## 2014-03-11 LAB — CBC WITH DIFFERENTIAL/PLATELET
BASOS ABS: 0.1 10*3/uL (ref 0.0–0.1)
BASOS PCT: 1.1 %
EOS PCT: 3.2 %
Eosinophil #: 0.2 10*3/uL (ref 0.0–0.7)
HCT: 29.6 % — AB (ref 40.0–52.0)
HGB: 10 g/dL — ABNORMAL LOW (ref 13.0–18.0)
Lymphocyte #: 1.5 10*3/uL (ref 1.0–3.6)
Lymphocyte %: 23.1 %
MCH: 31.7 pg (ref 26.0–34.0)
MCHC: 33.8 g/dL (ref 32.0–36.0)
MCV: 94 fL (ref 80–100)
MONO ABS: 1 x10 3/mm (ref 0.2–1.0)
Monocyte %: 16.2 %
NEUTROS PCT: 56.4 %
Neutrophil #: 3.6 10*3/uL (ref 1.4–6.5)
PLATELETS: 272 10*3/uL (ref 150–440)
RBC: 3.15 10*6/uL — ABNORMAL LOW (ref 4.40–5.90)
RDW: 14.9 % — ABNORMAL HIGH (ref 11.5–14.5)
WBC: 6.4 10*3/uL (ref 3.8–10.6)

## 2014-04-04 ENCOUNTER — Ambulatory Visit: Payer: Self-pay | Admitting: Hospice and Palliative Medicine

## 2014-05-05 DEATH — deceased

## 2014-10-26 NOTE — Consult Note (Signed)
PATIENT NAME:  Raymond Lane, [name] MR#:  [id number] DATE OF BIRTH:  [birth year]  DATE OF CONSULTATION:  03/09/2014  CONSULTING PHYSICIAN:  Midge Miniumarren Ainara Eldridge, MD  CONSULTING SERVICE: Gastroenterology   REASON FOR CONSULTATION: Upper GI bleed.   HISTORY OF PRESENT ILLNESS: This patient is a 64 year old gentleman who has a history of alcohol abuse and hepatitis C. The patient was treated with interferon in the past for his hepatitis C, and the family reports that the patient has not had any further sign of the hepatitis C. The family reports that the patient was diagnosed with cirrhosis as a result of the drinking and hepatitis C and [facility] was [facility]. The patient was having hepatocellular carcinoma screening and it was reported that last month the patient was found to have hepatocellular carcinoma. They had stated that the patient had a life expectancy of 2 months. That was reported to be approximately 1 month ago. He now comes in with multiple episodes of hematemesis and black tarry stools. The patient was brought to the ER and his hemoglobin was stable as were his vital signs. The patient was reported to be a DNR, and he has a history of esophageal varices, although the patient denies ever bleeding from esophageal varices in the past. Since admission, the patient's pulse has been stable and below 100, and his vital signs have been stable. The patient also has not had any further vomiting and his stools have returned to brown mixed with black and not totally black.   REVIEW OF SYSTEMS: A 10-point review of systems review is negative except what was stated above.   PAST MEDICAL HISTORY: Esophageal varices, hepatitis C, hepatoma, GERD, neuropathy, BPH, chronic pain.   ALLERGIES: No known drug allergies.   SOCIAL HISTORY: He used to be a smoker, but quit 10-15 years ago. Also a heavy drinker and also quit 10-20 years ago.   FAMILY HISTORY: Noncontributory.   CURRENT  MEDICATIONS:  1. Xanax.  2. Famotidine.  3. Fentanyl patch.  4. Paxil.  5. Neurontin.  6. Lactulose.  7. Oxycodone.  8. Propranolol.  9. Requip.  10. Flomax.  11. Torsemide.  12. Vitamin D.   PHYSICAL EXAMINATION:  GENERAL: The patient is sitting up in bed in the ICU in no apparent distress.  VITAL SIGNS: Temperature 98, pulse 81, respirations 12, blood pressure 147/92, pulse oximetry 95%.  HEENT: The patient appears jaundiced without JVD, without lymphadenopathy.  LUNGS: Clear to auscultation bilaterally.  HEART: Regular rate and rhythm without murmurs, rubs, or gallops.  ABDOMEN: Distended with positive ascites and fluid wave.  EXTREMITIES: Without cyanosis or clubbing. There is peripheral edema bilaterally.  NEUROLOGICAL: The patient is alert, slightly confused, which is likely due to his hepatic encephalopathy.  SKIN: Without rashes or lesions.   ANCILLARY SERVICES: Alkaline phosphatase 672, bilirubin 3.8, AST 112, ALT 72. Hemoglobin on admission 10.2, repeat was 10.8. Sodium down at 134. INR 1.1.   ASSESSMENT AND PLAN: This patient is a 64 year old gentleman who comes in with what appeared to be a variceal bleed. The patient appears to have stopped bleeding and is now on octreotide. The patient has ascites and with his gastrointestinal bleed should be on antibiotics and antibiotics will be started. The patient is also on a Protonix drip at the present time. I have discussed the poor prognosis with the patient and his wife and they state they understand. Right now, we will continue to treat him medically as upper endoscopy  with banding does not prolong survival in variceal bleeders, especially with his hepatocellular carcinoma diagnosis. The family states that they would like the patient to remain a DO NOT RESUSCITATE, but they would want the patient to have an upper endoscopy if he should start bleeding in the future.   Thank you very much for involving me in the care of this  patient. If you have any questions, please do not hesitate to call.    ____________________________ Midge Minium, MD dw:ts D: 03/09/2014 17:40:38 ET T: 03/09/2014 20:52:19 ET JOB#: [id number]  cc: Midge Minium, MD, <Dictator> Midge Minium MD ELECTRONICALLY SIGNED 03/12/2014 17:57

## 2014-10-26 NOTE — Consult Note (Signed)
Details:   - GI Note:  No bleeding.  Hgb stable.   Recs: - ok to advance diet - change to PO protonix - wean off octretide drip.  - Dr Servando SnareWohl to resume care tomorrow.   Electronic Signatures: Dow Adolphein, Lilla Callejo (MD)  (Signed 07-Sep-15 21:07)  Authored: Details   Last Updated: 07-Sep-15 21:07 by Dow Adolphein, Yadhira Mckneely (MD)

## 2014-10-26 NOTE — Consult Note (Signed)
Brief Consult Note: Diagnosis: Upper GI bleed in a patient with HCC and cirrhosis.   Discussed with Attending MD.   Comments: The patient has a very poor prognosis. He has ascites. The family wants to proceed with an EGD if he rebleeds. The patient has a stable Hb and BP at this time. The patient has cirrhosis and a GI bleed and needs to be on Ax.  Electronic Signatures: Midge MiniumWohl, Sui Kasparek (MD)  (Signed 05-Sep-15 14:57)  Authored: Brief Consult Note   Last Updated: 05-Sep-15 14:57 by Midge MiniumWohl, Daci Stubbe (MD)

## 2014-10-26 NOTE — Discharge Summary (Signed)
PATIENT NAME:  Raymond Lane, Raymond Lane MR#:  [id number] DATE OF BIRTH:  02-17-1951  DATE OF ADMISSION:  03/09/2014 DATE OF DISCHARGE:  03/12/2014  ADMITTING DIAGNOSIS: Gastrointestinal bleed.   DISCHARGE DIAGNOSES:  1.  Upper gastrointestinal bleed, is all suspected varicocele. 2.  Liver cirrhosis with severe ascites, status post paracentesis on 03/12/2014.  3.  History of hepatitis C, recent diagnosis of hepatocellular carcinoma.  4.  Gastroesophageal reflux disease.  5.  Neuropathy.  6.  Benign prostatic hypertrophy. 7.  Restless leg syndrome.  8.  Chronic pain syndrome.  9.  Depression.   DISCHARGE CONDITION: Stable.   DISCHARGE MEDICATIONS:  1.  The patient is to continue Enulose 30 mg twice daily.  2.  Fluoxetine 10 mg p.o. twice daily. 3.  Gabapentin 300 mg p.o. twice daily.  4.  Oxycodone 10 mg 1 to 2 tablets 4 times daily. 5.  Alprazolam 0.25 mg as needed. 6.  Fentanyl 100 mcg patch every 72 hours. 7.  Ropinirole 0.5 mg half tablet, which will be 0.25 mg once daily at bedtime.  8.  Tamsulosin 0.4 mg once daily.  9.  Vitamin D3 at 5000 units 2 tablets once daily.  10.  Oral antacids as needed.  11.  Nasal relief spray as needed.  12.  Propanolol 10 mg every 12 hours.  13.  Omeprazole 40 mg p.o. twice daily.  14.  Spironolactone 50 mg p.o. daily.  15.  Furosemide 20 mg p.o. daily.  16.  Levofloxacin 500 mg every 24 hours for 7 more days.   The patient is not to take famotidine, torsemide or headache relief medication, in fact he is recommended not to take any nonsteroidal anti-inflammatory medications at all such as aspirin, Motrin or any other medications.  HOME HEALTH: None.   HOME OXYGEN: None.   DIET: Two gram salt, mechanical soft.   ACTIVITY LIMITATIONS: As tolerated.   REFERRAL: To hospice at home.   FOLLOWUP APPOINTMENT: With Dr. Juel Lane in 2 days after discharge;  Ms Raymond Lane? [facility] in 1 week after discharge.   CONSULTANTS:  Care management, social work, Dr. Servando SnareWohl gastroenterology, Dr. Shelle Ironein gastroenterology, palliative care.   RADIOLOGIC STUDIES: Three-way abdominal x-ray including PA of chest on 03/09/2014 revealing no evidence of acute cardiopulmonary disease, no evidence of small bowel obstruction or free air. Ultrasound-guided paracentesis 03/12/2014, total of approximately 8.75 liters of yellow fluid was removed, successful ultrasound-guided thoracentesis yielding 8.75 liters of  ascitic fluid. The patient is a [age] year old Caucasian male with past medical history significant for history of hepatitis C, history of recent diagnosis of hepatocellular carcinoma who presents to the hospital with complaints of multiple episodes of hematemesis. Please refer to Dr. Hilbert Lane's admission note on 03/09/2014. On arrival to the Emergency Room, the patient's temperature was 98.1, pulse was 81, respiration was 11, blood pressure 126/80, saturation was 96% on room air. Physical examination was unremarkable.   The patient's laboratory data done in the Emergency Room revealed elevated BUN of 31, sodium 134, glucose 149, otherwise BMP was unremarkable. The patient's liver enzymes showed albumin level of 2.3, total bilirubin was 3.8, alkaline phosphatase 672, AST as well as ALT 112 and 72 respectively. White blood cell count was elevated to 15.4, hemoglobin was 11.2, platelet count 294,000. Coagulation panel was checked and pro time was found to be 14.3, INR was 1.1. Occult blood in feces test was positive. EKG showed normal sinus rhythm at 82 beats per minute, septal infarct, age  indeterminate and no acute ST-T changes were noted. The patient's 3-way abdominal x-ray including PA of chest was unremarkable.   The patient was admitted to the hospital for further evaluation. Consultation with gastroenterologist, Dr. Servando Snare, was obtained. Dr. Servando Snare saw the patient in consultation on 03/09/2014. He felt the patient's upper GI bleed very likely is  varicocele in origin. He stated that the patient has very poor prognosis. He has ascites, but since the patient has stable hemoglobin level as well as blood pressure, he did not feel that the patient necessarily needs to have EGD performed unless he rebleeds. Because of cirrhosis, as well as gastrointestinal bleed, he recommended antibiotic therapy. The patient was initiated on Levaquin and followed while he was in the hospital. His hemoglobin level was checked intermittently. His hemoglobin level was noted to be 11.2 on the day of admission, it drifted down to 9.9 on 03/10/2014, however, remained stable over the past 48 hours, and on the day of discharge, 03/12/2014, the patient's hemoglobin level was 10.0. The patient did not have anymore bleed. He was evaluated by Dr. Shelle Iron who felt that medical management would be appropriate. He recommended to wean him off octreotide and continue PPIs orally. The patient was initiated on soft diet and tolerated this well. He was recommended to continue proton pump inhibitors at home and follow up with his primary care physician as an outpatient. Since he had severe ascites, paracentesis was performed on 03/12/2014, yielding almost 9 liters of ascitic fluid. The patient's blood pressure was very closely monitored post paracentesis and remained stable. It was felt that immediately after paracentesis the patient needs to have lower doses of diuretics as well as propranolol. He was recommended only 50 mg of spironolactone as well as 10 mg of furosemide daily to be started on 03/13/2014.   In regards to his chronic medical problems such as gastroesophageal reflux disease, neuropathy, BPH, restless leg syndrome, chronic pain as well as depression, the patient was advised to continue his outpatient medications. The patient is being discharged to home in stable condition with the above-mentioned medications and follow-up.   On the day of discharge, 03/12/2014, the patient's vital  signs: Temperature was 98.3, pulse was 67, respirations were 16, blood pressure 131/69, saturation was 97% on 2 liters of oxygen through nasal cannula at rest.   Of note, the patient was seen by palliative care and recommended hospice for which he was agreeable.    ____________________________ Katharina Caper, MD rv:TT D: 03/12/2014 14:04:00 ET T: 03/12/2014 14:57:51 ET JOB#: [id number]  cc: Corky Downs, MD unknown cc Katharina Caper, MD, <Dictator>    Jakell Trusty MD ELECTRONICALLY SIGNED 03/24/2014 18:07

## 2014-10-26 NOTE — H&P (Signed)
PATIENT NAME:  Raymond Lane, Raymond Lane MR#:  [id number] DATE OF BIRTH:  [birth year]  DATE OF ADMISSION:  03/09/2014  PRIMARY CARE PHYSICIAN: [facility]   CHIEF COMPLAINT: Bloody vomitus and black tarry stools.   HISTORY OF PRESENT ILLNESS: This is a 64 year old male who presents to the Emergency Room due to multiple episodes of hematemesis that started at 4:00 this morning. He has also been having black, tarry stools now for the past few days to a week. He was brought to the ER for further evaluation. The patient does have a history of hepatitis C and recently diagnosed with a hepatoma and given less than 2 months to live. He also has a history of esophageal varices. He does have significant risk factors for having GI bleeding as mentioned. The patient presented to the Emergency Room and was noted to be hemodynamically stable, but noted to be slightly anemic. Hospitalist services were contacted for further treatment and evaluation.   REVIEW OF SYSTEMS:  CONSTITUTIONAL: Documented fever. Positive generalized weakness. No weight gain or weight loss.  EYES: No blurred or double vision.  ENT: No tinnitus. No postnasal drip. No redness of the oropharynx.  RESPIRATORY: No cough, no wheeze or hemoptysis. No dyspnea.  CARDIOVASCULAR: No chest pain, no orthopnea, no palpitations, no syncope.  GASTROINTESTINAL: Positive nausea. Positive vomiting. Positive hematemesis. Positive melena. No hematochezia.  GENITOURINARY: No dysuria or hematuria.  ENDOCRINE: No polyuria or nocturia. No heat or cold intolerance.  HEMATOLOGY: No anemia, no bruising, no bleeding.  INTEGUMENTARY: No rashes. No lesions.  MUSCULOSKELETAL: No arthritis, no swelling, no gout.  NEUROLOGIC: No numbness or tingling. No ataxia. No seizure-type activity. PSYCHIATRIC: No anxiety, no insomnia, no ADD. Positive depression.   PAST MEDICAL HISTORY: Consistent with a history of esophageal varices, history of hepatitis C, recently diagnosed with  hepatoma, GERD, neuropathy, BPH, chronic pain.   ALLERGIES: No known drug allergies.   SOCIAL HISTORY: Used to be a smoker, quit about 10 to 15 years ago. Does have a 20 to 30 pack-year smoking history. Also used to drink heavily about a sixpack of beer daily, quit about 15 to 20 years ago. No illicit drug abuse. Lives at home with his wife.   FAMILY HISTORY: Mother is still alive, has hypertension, and COPD. Father died from complications of pneumonia.   CURRENT MEDICATIONS: As follows: Xanax 0.25 mg at bedtime as needed, famotidine 20 mg b.i.d., fentanyl patch 100 mcg every 72 hours, fluoxetine 20 mg 2 tabs daily, Neurontin 300 mg b.i.d., lactulose 30 mL b.i.d., oxycodone 10 mg 1 to 2 tablets every 4 hours as needed for pain, propranolol 20 mg daily, Requip 0.25 mg at bedtime, Flomax 0.4 mg daily, torsemide 100 mg daily, vitamin D3 at 5000 international units daily, and omeprazole 20 mg daily.   PHYSICAL EXAMINATION: Presently is as follows:  VITAL SIGNS: Temperature is 98.1, pulse 81, respirations 11, blood pressure 126/80, sats 96% on room air.  GENERAL: He is a lethargic-appearing male but in no apparent distress.  HEAD, EYES, EARS, NOSE AND THROAT: Atraumatic, normocephalic. Extraocular muscles are intact. Pupils equal and reactive to light. Sclerae anicteric. No conjunctival injection. No pharyngeal erythema.  NECK: Supple. There is no jugular venous distention. No bruits, no lymphadenopathy, no thyromegaly.  HEART: Regular rate and rhythm. No murmurs, no rubs, no clicks.  LUNGS: Clear to auscultation bilaterally. No rales, rhonchi, no wheezes.  ABDOMEN: Soft, flat distended. Positive fluid wave. Hypoactive bowel sounds. No hepatosplenomegaly appreciated.  EXTREMITIES: No evidence of  any cyanosis, clubbing. Does have peripheral edema, +1 to 2 pitting edema from the knees and ankles bilaterally. With +2 pedal and radial pulses bilaterally.  NEUROLOGIC: The patient is alert, awake, and  oriented x 3 with no focal motor or sensory deficits appreciated bilaterally.  SKIN: Moist and warm with no rashes appreciated.  LYMPHATIC: There is no cervical or axillary lymphadenopathy.   LABORATORY DATA: Serum glucose of 149, BUN 31, creatinine 1.14, sodium 134, potassium 3.9, chloride 97, bicarbonate 31. The patient's albumin is 2.3, alkaline phosphatase 672, AST 112, ALT 72, total bilirubin of 3.8. White cell count of 15.4, hemoglobin 11.2, hematocrit 34.8, platelet count 294. INR is 1.1.   The patient did have abdominal 3-way done which showed no evidence of acute cardiopulmonary disease, or no evidence of bowel obstruction or free air.   ASSESSMENT AND PLAN: This is a 63 year old male with past medical history of hepatitis C, liver cirrhosis, history of hepatoma, BPH, history of esophageal varices, gastroesophageal reflux disease, depression, neuropathy, presents to the hospital due to multiple episodes of hematemesis and also melanotic stools.  1.  Gastrointestinal bleed, likely the cause of the patient's hematemesis and melenic stools. I suspect this is probably esophageal variceal bleeding given his history of hepatitis C and hepatoma. I will place the patient on octreotide and Protonix drip for now. Follow serial hemoglobins, and they are currently stable. We will get a gastroenterology consult. Discussed the case with Dr. Servando Snare who will see the patient. Will keep him n.p.o. except ice chips and meds.  2.  History of hepatitis C and hepatoma. The patient has been given a very poor prognosis with less than 2 months to live. I will get a palliative care consult and discuss goals of care.  3.  Gastroesophageal reflux disease. Continue Protonix.  4.  Neuropathy. Continue Neurontin.  5.  BPH. Continue Flomax.  6.  Restless leg syndrome. Continue Requip.  7.  Chronic pain. Continue with the patient's fentanyl patch and oxycodone as needed for pain.   CODE STATUS: The patient is a DNI/DNR.    TIME SPENT ON ADMISSION: 50 minutes.    ____________________________ Rolly Pancake. Cherlynn Kaiser, MD vjs:at D: 03/09/2014 09:38:48 ET T: 03/09/2014 10:56:38 ET JOB#: [id number]  cc: Rolly Pancake. Cherlynn Kaiser, MD, <Dictator> Houston Siren MD ELECTRONICALLY SIGNED 03/13/2014 15:55

## 2014-10-27 NOTE — H&P (Signed)
PATIENT NAME:  Raymond Lane, Raymond Lane MR#:  [id number] DATE OF BIRTH:  1951-03-25  DATE OF ADMISSION:  10/16/2011  ADDENDUM:   TIME SPENT:   Total time spent for History and Physical and coordination of care was 50 minutes.   ____________________________ Starleen Armsawood S. Nhung Danko, MD dse:cbb D: 10/16/2011 04:46:11 ET T: 10/16/2011 10:01:39 ET JOB#: [id number]  cc: Starleen Armsawood S. Jalila Goodnough, MD, <Dictator> Jarrah Babich Teena IraniS Kayler Rise MD ELECTRONICALLY SIGNED 10/17/2011 0:09

## 2014-10-27 NOTE — H&P (Signed)
PATIENT NAME:  Raymond Lane, Raymond Lane MR#:  [id number] DATE OF BIRTH:  [birth year]  DATE OF ADMISSION:  10/16/2011  REFERRING PHYSICIAN: Dr. Synetta Shadow  PRIMARY CARE PHYSICIAN:   CHIEF COMPLAINT: Chest pain.  HISTORY OF PRESENT ILLNESS: Mr. Mcfarlane is a 64 year old male with significant past medical history of chronic hepatitis C status post extended periods of interferon and ribavirin with negative viral load and treatment who has been followed at [facility] for that as well as history of hypertension, depression, degenerative disc disease and history of alcohol abuse-remote, nothing recent, complains of chest pain. Patient reports chest pain started at rest while he was in bed today. Patient describes his pain as pressure, dull, aching, nonradiating accompanied by sweating. No relieving or provoking factors. Denies any nausea, vomiting, palpitations, dizziness, shortness of breath accompanying that chest pain. As well denies any previous episode of that chest pain. In ED patient had EKG done which did show normal sinus rhythm, did show partial right bundle branch block which is not clear if it is old or new as patient has no previous EKG on record. Patient had negative troponins, had slightly elevated CK total but CK-MB was within normal limits. Patient was given treatment of aspirin in ED and admission was requested by hospitalist service to rule out cardiac origin of chest pain.   PAST MEDICAL HISTORY: 1. Chronic hepatitis C status post extended period of interferon and ribavirin.  2. Hypertension. 3. Depression. 4. Chronic pain syndrome secondary to lower back pain secondary to degenerative disc disease from L1 to L4.  5. Benign prostatic hypertrophy.   PAST SURGICAL HISTORY: Back surgery.  SOCIAL HISTORY: Patient is on disability. Lives alone. He quit smoking 20 years ago. No history of recent alcohol abuse. No history of illicit drug abuse.  FAMILY HISTORY: Not significant for cardiac disease at  young age.   MEDICATIONS:  1. Alfuzosin 10 mg oral daily. 2. Benazepril 20 mg oral daily. 3. Bupropion 150 mg q.12 hours b.i.d.  4. Cyclobenzaprine 10 mg t.i.d.  5. Docusate sodium 200 mg b.i.d.  6. Fish oil 1000 mg oral 4 capsules daily. 7. Fluoxetine 20 mg oral daily. 8. Gabapentin 300 mg oral t.i.d.  9. Glucosamine chondroitin 1 tablet b.i.d.  10. Methadone 10 mg oral t.i.d. 11. Omeprazole 20 mg b.i.d. 12. Oxycodone 10 mg as needed 3 to 4 times a day.  13. Propranolol 20 mg oral daily. 14. Vitamin D2 10,000 units daily.  ALLERGIES: No known drug allergies.     REVIEW OF SYSTEMS: CONSTITUTIONAL: Denies any fever, fatigue, weakness. EYES: Denies any blurry vision, double vision, pain, redness. ENT: Denies any tinnitus, ear pain, hearing loss. RESPIRATORY: Denies any cough, wheezing, hemoptysis, dyspnea. CARDIOVASCULAR: Has complaint of chest pressure. Denies any orthopnea, edema, arrhythmia, palpitations. GASTROINTESTINAL: Denies any nausea, vomiting, diarrhea, constipation, abdominal pain. GENITOURINARY: Denies any dysuria, hematuria. ENDOCRINE: Denies any polyuria, polydipsia, heat or cold intolerance. MUSCULOSKELETAL: Has complaints of chronic back pain. ENDOCRINE: Denies any numbness, weakness, dysarthria. PSYCH: Denies any drug abuse or alcohol abuse. Has history of depression.   PHYSICAL EXAMINATION: VITAL SIGNS: Temperature 99.3, pulse 80, respiratory rate 16, blood pressure 141/83, saturating 95% on room air.  GENERAL: Well nourished male, comfortable, no apparent distress.  HEENT: Head atraumatic, normocephalic. Pupils equal, reactive to light. Pink conjunctiva. Anicteric sclera. Moist oral mucosa.   NECK: Supple. No thyromegaly. No JVD.   CHEST: Good air entry bilaterally. No wheezing, rales, rhonchi.   CARDIOVASCULAR: S1, S2 heard. No rubs, murmurs, gallops.  ABDOMEN: Soft, nontender, nondistended. Bowel sounds present.   EXTREMITIES: No edema. No clubbing. No  cyanosis.   NEUROLOGICAL: Cranial nerves grossly intact. Motor 5/5.   PSYCH: Appropriate affect. Awake, alert x3. Intact judgement and insight.   LABORATORY, DIAGNOSTIC AND RADIOLOGICAL DATA: Glucose 116, BUN 12, creatinine 0.96, sodium 140, potassium 4.2, chloride 103, carbon dioxide 31, total bilirubin 1.5, alkaline phosphatase 110, AST 36, ALT 27, troponin less than 0.02, total CK 343, CK-MB 3, WBC 8.3, hemoglobin 15, hematocrit 45.4, platelets 170.   ASSESSMENT AND PLAN: 1. Chest pain. This appears to be atypical, most likely indigestion. At this point pain is resolved. Will admit patient to observation service. Will cycle three sets of cardiac enzymes and if negative patient can be followed as an outpatient by his primary medical physician. Patient was given aspirin in ED, will continue him on aspirin during his hospital stay and will check lipid profile to see if he needs to be started on statin.  2. Hypertension. Blood pressure is well controlled. Will continue patient on benazepril. 3. Hepatitis C. Will continue patient on propranolol. Patient is being followed by [facility] and reports his titers have been negative.  4. Chronic pain syndrome/lower back pain. Will continue patient on his regular dose of methadone and gabapentin and cyclobenzaprine and p.r.n. oxycodone. 5. Depression. Will continue patient on bupropion.  6. DVT prophylaxis. Sub-Q heparin 5000 units q.12 hours.  7. CODE STATUS: Patient is FULL CODE.  total time spent for H&P and care 55minutes ____________________________ Starleen Armsawood S. Camora Tremain, MD dse:cms D: 10/16/2011 04:29:29 ET T: 10/16/2011 09:54:05 ET  JOB#: [id number]  Lola Czerwonka Teena IraniS Marsalis Beaulieu MD ELECTRONICALLY SIGNED 10/17/2011 0:11

## 2014-10-27 NOTE — Discharge Summary (Signed)
PATIENT NAME:  Raymond Lane, Raymond Lane MR#:  [id number] DATE OF BIRTH:  [birth year]  DATE OF ADMISSION:  10/16/2011 DATE OF DISCHARGE:  10/16/2011  REASON FOR ADMISSION: Chest pain.   DISCHARGE DIAGNOSES: 1. Chest pain, rule out coronary artery disease, no myocardial infarction. 2. Hypertension, controlled.   HOSPITAL COURSE: The patient is a 64 year old male with a history of hypertension, Hepatitis C, depression, chronic pain syndrome, benign prostatic hypertrophy, chest pain which is pressure-like, dull, aching, no radiation, no relieving or provoking factors. EKG did show normal sinus rhythm and a partial right bundle branch block. The patient was admitted for chest pain which is not typical, most likely indigestion. The patient's troponin levels were negative for two sets. The patient had no more chest pain after admission. 1. For hypertension, the patient has been treated with benazepril which is controlled. The patient mentioned that her brother and uncle had a heart attack at the age of 65's to 3550's so I ordered a stress test to rule out coronary artery disease, however, stress test could not be done today or tomorrow. The patient wanted to go home and will have the stress test as outpatient. I discussed with the patient and the nurse since the patient has no symptoms, troponin levels are negative, and the patient's vital signs are stable and is clinically stable the patient will be discharged today and follow-up stress test on Monday, 10/18/2011. The patient will need to continue aspirin.  2. Chest pain, rule out coronary artery disease.  3. Hypertension, controlled.  CONDITION: Stable.  MEDICATIONS: 1. Oxycodone 10 mg p.o. 4 times daily. 2. Benazepril 20 mg p.o. daily. 3. Alfuzosin 10 mg p.o. daily extended-release. 4. Fluoxetine 20 mg p.o. once daily.  5. Omeprazole 20 mg p.o. daily.  6. Fish Oil 1000 mg p.o. daily.  7. Glucosamine-chondroitin MSM Complex oral tablet one tablet  b.i.d. 8. Propranolol 20 mg p.o. daily.  9. Gabapentin 300 mg p.o. t.i.d.   10. Vitamin D2 10,000 units once daily.  11. Methadone 10 mg p.o. t.i.d.  12. Enulose 30 mg b.i.d.   ADDITIONAL MEDICATION: Aspirin 81 mg p.o. daily.  DIET: Low sodium diet.   ACTIVITY: As tolerated.  FOLLOW-UP:  1. Follow-up with PCP within 1 to 2 weeks. 2. Follow-up stress test on Monday, 10/18/2011.   TIME SPENT: About 30 minutes.  ____________________________ Shaune PollackQing Argusta Mcgann, MD qc:drc D: 10/16/2011 18:19:10 ET T: 10/18/2011 13:38:45 ET JOB#: [id number]  cc: Shaune PollackQing Anagabriela Jokerst, MD, <Dictator> Shaune PollackQING Syana Degraffenreid MD ELECTRONICALLY SIGNED 10/19/2011 17:28
